# Patient Record
Sex: Female | Born: 1995 | Race: Black or African American | Hispanic: No | Marital: Single | Smoking: Never smoker
Health system: Southern US, Community
[De-identification: ages and names within clinical notes are randomized; demographics above are authoritative.]

## PROBLEM LIST (undated history)

## (undated) DIAGNOSIS — I10 Essential (primary) hypertension: Secondary | ICD-10-CM

## (undated) DIAGNOSIS — O139 Gestational [pregnancy-induced] hypertension without significant proteinuria, unspecified trimester: Secondary | ICD-10-CM

## (undated) DIAGNOSIS — Z789 Other specified health status: Secondary | ICD-10-CM

## (undated) HISTORY — DX: Gestational (pregnancy-induced) hypertension without significant proteinuria, unspecified trimester: O13.9

## (undated) HISTORY — PX: NO PAST SURGERIES: SHX2092

---

## 2020-02-06 ENCOUNTER — Encounter (HOSPITAL_COMMUNITY): Payer: Self-pay | Admitting: Obstetrics and Gynecology

## 2020-02-06 ENCOUNTER — Emergency Department (HOSPITAL_COMMUNITY)
Admission: EM | Admit: 2020-02-06 | Discharge: 2020-02-06 | Disposition: A | Discharge status: Home / Self Care | Payer: BC Managed Care – PPO | Attending: Emergency Medicine | Admitting: Emergency Medicine

## 2020-02-06 ENCOUNTER — Other Ambulatory Visit: Payer: Self-pay

## 2020-02-06 DIAGNOSIS — W228XXA Striking against or struck by other objects, initial encounter: Secondary | ICD-10-CM | POA: Insufficient documentation

## 2020-02-06 DIAGNOSIS — Y929 Unspecified place or not applicable: Secondary | ICD-10-CM | POA: Insufficient documentation

## 2020-02-06 DIAGNOSIS — Y999 Unspecified external cause status: Secondary | ICD-10-CM | POA: Diagnosis not present

## 2020-02-06 DIAGNOSIS — Y939 Activity, unspecified: Secondary | ICD-10-CM | POA: Diagnosis not present

## 2020-02-06 DIAGNOSIS — S0990XA Unspecified injury of head, initial encounter: Secondary | ICD-10-CM | POA: Diagnosis not present

## 2020-02-06 NOTE — Discharge Instructions (Signed)
Follow-up with your primary care provider in 2 days for continued evaluation.  Take Tylenol as needed for pain.  Return to the emergency room immediately for new or worsening symptoms or concerns, such as passing out, vomiting, worsening headache, or any concerns at all.

## 2020-02-06 NOTE — ED Provider Notes (Signed)
[facility]-EMERGENCY DEPT Provider Note   CSN: [id number] Arrival date & time: 02/06/20  1901     History Chief Complaint  Patient presents with  . Head Injury    Sydney Rice is a 24 y.o. female.  HPI  24 year old female presents status post head injury.  Patient states when she was walking approximately 9 PM last night she walked into the metal edge of a mantle.  She states that at the time she had some floaters in her vision and felt lightheaded.  She states that she did have a headache which resolved and then returned.  When the headache returned she became concerned and presented to the emergency room.  She denies LOC.  She denies syncope, vomiting, speech or gait difficulty, numbness, tingling.  Patient states she had to leave work early today secondary to her headache.    eviewed. No pertinent past medical history.  There are no problems to display for this patient.   OB History   No obstetric history on file.     History reviewed. No pertinent family history.  Social History   Tobacco Use  . Smoking status: Not on file  Substance Use Topics  . Alcohol use: Not on file  . Drug use: Not on file    Home Medications Prior to Admission medications   Not on File    Allergies    Patient has no allergy information on record.  Review of Systems   Review of Systems  Constitutional: Negative for chills and fever.  HENT: Negative for rhinorrhea and sore throat.   Eyes: Positive for visual disturbance.  Respiratory: Negative for cough and shortness of breath.   Cardiovascular: Negative for chest pain and leg swelling.  Gastrointestinal: Negative for abdominal pain, diarrhea, nausea and vomiting.  Genitourinary: Negative for dysuria, frequency and urgency.  Musculoskeletal: Negative for joint swelling and neck pain.  Skin: Negative for rash and wound.  Neurological: Positive for light-headedness. Negative for syncope and numbness.    All other systems reviewed and are negative.   Physical Exam Updated Vital Signs BP (!) 140/100 (BP Location: Left Arm)   Pulse 86   Temp 98.8 F (37.1 C) (Oral)   Resp 17   LMP 02/03/2020   SpO2 100%   Physical Exam Vitals and nursing note reviewed.  Constitutional:      Appearance: She is well-developed.  HENT:     Head: Normocephalic and atraumatic. No raccoon eyes, Battle's sign, right periorbital erythema or left periorbital erythema.     Jaw: There is normal jaw occlusion.     Right Ear: Tympanic membrane normal. No hemotympanum.     Left Ear: Tympanic membrane normal. No hemotympanum.     Nose: Nose normal.     Right Nostril: No septal hematoma.     Left Nostril: No septal hematoma.  Eyes:     Conjunctiva/sclera: Conjunctivae normal.  Cardiovascular:     Rate and Rhythm: Normal rate and regular rhythm.     Heart sounds: Normal heart sounds. No murmur heard.   Pulmonary:     Effort: Pulmonary effort is normal. No respiratory distress.     Breath sounds: Normal breath sounds. No wheezing or rales.  Abdominal:     General: Bowel sounds are normal. There is no distension.     Palpations: Abdomen is soft.     Tenderness: There is no abdominal tenderness.  Musculoskeletal:        General: No tenderness or deformity.  Normal range of motion.     Cervical back: Neck supple.  Skin:    General: Skin is warm and dry.     Findings: No erythema or rash.  Neurological:     General: No focal deficit present.     Mental Status: She is alert and oriented to person, place, and time.     GCS: GCS eye subscore is 4. GCS verbal subscore is 5. GCS motor subscore is 6.     Cranial Nerves: Cranial nerves are intact.     Sensory: Sensation is intact.     Motor: Motor function is intact.     Coordination: Coordination is intact.     Gait: Gait is intact.  Psychiatric:        Behavior: Behavior normal.     ED Results / Procedures / Treatments   Labs (all labs ordered are  listed, but only abnormal results are displayed) Labs Reviewed - No data to display  EKG None  Radiology No results found.  Procedures Procedures (including critical care time)  Medications Ordered in ED Medications - No data to display  ED Course  I have reviewed the triage vital signs and the nursing notes.  Pertinent labs & imaging results that were available during my care of the patient were reviewed by me and considered in my medical decision making (see chart for details).    MDM Rules/Calculators/A&P                            Resents 24 hours status post head injury.  She notes a headache which returned and prompted her concern.  She is very well-appearing, no acute distress, nontoxic, non-lethargic.  Neuro exam is nonfocal, nonlateralizing.  Mechanism of injury is mild, neuro exam is unremarkable.  Patient has no signs of head injury on physical exam.  Discussed how a head CT is not indicated at this time and patient expressed understanding.    She is agreeable to continue to monitor her symptoms at home.  Given return precautions.  Patient ready and stable for discharge.   At this time there does not appear to be any evidence of an acute emergency medical condition and the patient appears stable for discharge with appropriate outpatient follow up.Diagnosis was discussed with patient who verbalizes understanding and is agreeable to discharge.    Final Clinical Impression(s) / ED Diagnoses Final diagnoses:  None    Rx / DC Orders ED Discharge Orders    None       Rueben Bash 02/06/20 2306    Charlynne Pander, MD 02/07/20 [phone]

## 2020-02-06 NOTE — ED Triage Notes (Signed)
Patient reports head pain after walking into a fireplace mantel.

## 2020-06-10 ENCOUNTER — Encounter (HOSPITAL_COMMUNITY): Payer: Self-pay | Admitting: Emergency Medicine

## 2020-06-10 ENCOUNTER — Inpatient Hospital Stay (HOSPITAL_COMMUNITY): Payer: BC Managed Care – PPO

## 2020-06-10 ENCOUNTER — Other Ambulatory Visit: Payer: Self-pay

## 2020-06-10 ENCOUNTER — Inpatient Hospital Stay (HOSPITAL_COMMUNITY)
Admission: AD | Admit: 2020-06-10 | Discharge: 2020-06-10 | Disposition: A | Discharge status: Home / Self Care | Payer: BC Managed Care – PPO | Attending: Family Medicine | Admitting: Family Medicine

## 2020-06-10 DIAGNOSIS — O26891 Other specified pregnancy related conditions, first trimester: Secondary | ICD-10-CM | POA: Insufficient documentation

## 2020-06-10 DIAGNOSIS — N760 Acute vaginitis: Secondary | ICD-10-CM

## 2020-06-10 DIAGNOSIS — R1031 Right lower quadrant pain: Secondary | ICD-10-CM | POA: Diagnosis not present

## 2020-06-10 DIAGNOSIS — O26899 Other specified pregnancy related conditions, unspecified trimester: Secondary | ICD-10-CM

## 2020-06-10 DIAGNOSIS — O23591 Infection of other part of genital tract in pregnancy, first trimester: Secondary | ICD-10-CM | POA: Insufficient documentation

## 2020-06-10 DIAGNOSIS — Z3A08 8 weeks gestation of pregnancy: Secondary | ICD-10-CM | POA: Insufficient documentation

## 2020-06-10 DIAGNOSIS — R109 Unspecified abdominal pain: Secondary | ICD-10-CM | POA: Diagnosis not present

## 2020-06-10 LAB — CBC
HCT: 37.1 % (ref 36.0–46.0)
Hemoglobin: 12.5 g/dL (ref 12.0–15.0)
MCH: 29.6 pg (ref 26.0–34.0)
MCHC: 33.7 g/dL (ref 30.0–36.0)
MCV: 87.7 fL (ref 80.0–100.0)
Platelets: 265 10*3/uL (ref 150–400)
RBC: 4.23 MIL/uL (ref 3.87–5.11)
RDW: 12.7 % (ref 11.5–15.5)
WBC: 7.6 10*3/uL (ref 4.0–10.5)
nRBC: 0 % (ref 0.0–0.2)

## 2020-06-10 LAB — WET PREP, GENITAL
Sperm: NONE SEEN
Trich, Wet Prep: NONE SEEN
Yeast Wet Prep HPF POC: NONE SEEN

## 2020-06-10 LAB — URINALYSIS, ROUTINE W REFLEX MICROSCOPIC
Bilirubin Urine: NEGATIVE
Glucose, UA: NEGATIVE mg/dL
Hgb urine dipstick: NEGATIVE
Ketones, ur: NEGATIVE mg/dL
Leukocytes,Ua: NEGATIVE
Nitrite: NEGATIVE
Protein, ur: NEGATIVE mg/dL
Specific Gravity, Urine: 1.024 (ref 1.005–1.030)
pH: 5 (ref 5.0–8.0)

## 2020-06-10 LAB — POC URINE PREG, ED: Preg Test, Ur: POSITIVE — AB

## 2020-06-10 LAB — HCG, QUANTITATIVE, PREGNANCY: hCG, Beta Chain, Quant, S: 137832 m[IU]/mL — ABNORMAL HIGH (ref <5)

## 2020-06-10 LAB — ABO/RH: ABO/RH(D): O POS

## 2020-06-10 MED ORDER — METRONIDAZOLE 500 MG PO TABS: 500.0000 mg | ORAL_TABLET | Freq: Two times a day (BID) | ORAL | 0 refills | Status: DC

## 2020-06-10 MED ORDER — ACETAMINOPHEN 500 MG PO TABS
1000.0000 mg | ORAL_TABLET | Freq: Four times a day (QID) | ORAL | Status: DC | PRN
  Administered 2020-06-10: 1000 mg via ORAL
  Filled 2020-06-10: qty 2

## 2020-06-10 NOTE — MAU Provider Note (Signed)
History     CSN: [id number]  Arrival date and time: 06/10/20 1420   First Provider Initiated Contact with Patient 06/10/20 1638      Chief Complaint  Patient presents with  . Flank Pain   24 y.o. G1 @[redacted]w[redacted]d  by LMP presenting with RLQ pain and back pain. Pain started 1-1.5 mos ago. Describes as aching, RLQ, constant and radiated into right side of back. Has taken Tylenol and used heating pad which helps. Denies urinary sx. No VB. Having white vaginal discharge with malodor.    OB History    Gravida  1   Para      Term      Preterm      AB      Living        SAB      TAB      Ectopic      Multiple      Live Births              History reviewed. No pertinent past medical history.  History reviewed. No pertinent surgical history.  History reviewed. No pertinent family history.  Social History   Tobacco Use  . Smoking status: Never Smoker  . Smokeless tobacco: Never Used  Substance Use Topics  . Alcohol use: Not on file  . Drug use: Not on file    Allergies: No Known Allergies  No medications prior to admission.    Review of Systems  Constitutional: Negative for fever.  Gastrointestinal: Positive for abdominal pain, constipation and nausea. Negative for diarrhea and vomiting.  Genitourinary: Positive for frequency and vaginal discharge. Negative for dysuria, hematuria, urgency and vaginal bleeding.  Musculoskeletal: Positive for back pain.   Physical Exam   Blood pressure 122/75, pulse 75, temperature 98.3 F (36.8 C), temperature source Oral, resp. rate 20, height 5\' 1"  (1.549 m), weight 44.7 kg, last menstrual period 04/13/2020, SpO2 100 %.  Physical Exam Vitals and nursing note reviewed. Exam conducted with a chaperone present.  Constitutional:      Appearance: Normal appearance.  HENT:     Head: Normocephalic and atraumatic.  Cardiovascular:     Rate and Rhythm: Normal rate.  Pulmonary:     Effort: Pulmonary effort is normal. No  respiratory distress.  Abdominal:     General: There is no distension.     Palpations: Abdomen is soft. There is no mass.     Tenderness: There is no abdominal tenderness. There is no guarding or rebound.  Genitourinary:    Comments: External: no lesions or erythema Vagina: white, frothy discharge Uterus: + enlarged, anteverted, non tender, no CMT Adnexae: no masses, no tenderness left, no tenderness right Cervix closed Musculoskeletal:        General: Normal range of motion.     Cervical back: Normal range of motion.  Skin:    General: Skin is warm and dry.  Neurological:     General: No focal deficit present.     Mental Status: She is alert and oriented to person, place, and time.  Psychiatric:        Mood and Affect: Mood normal.        Behavior: Behavior normal.    Results for orders placed or performed during the hospital encounter of 06/10/20 (from the past 24 hour(s))  POC Urine Pregnancy, ED (not at [facility])     Status: Abnormal   Collection Time: 06/10/20  2:55 PM  Result Value Ref Range   Preg Test, Ur  POSITIVE (A) NEGATIVE  Urinalysis, Routine w reflex microscopic Urine, Clean Catch     Status: None   Collection Time: 06/10/20  3:58 PM  Result Value Ref Range   Color, Urine YELLOW YELLOW   APPearance CLEAR CLEAR   Specific Gravity, Urine 1.024 1.005 - 1.030   pH 5.0 5.0 - 8.0   Glucose, UA NEGATIVE NEGATIVE mg/dL   Hgb urine dipstick NEGATIVE NEGATIVE   Bilirubin Urine NEGATIVE NEGATIVE   Ketones, ur NEGATIVE NEGATIVE mg/dL   Protein, ur NEGATIVE NEGATIVE mg/dL   Nitrite NEGATIVE NEGATIVE   Leukocytes,Ua NEGATIVE NEGATIVE  CBC     Status: None   Collection Time: 06/10/20  4:08 PM  Result Value Ref Range   WBC 7.6 4.0 - 10.5 K/uL   RBC 4.23 3.87 - 5.11 MIL/uL   Hemoglobin 12.5 12.0 - 15.0 g/dL   HCT 11.9 36 - 46 %   MCV 87.7 80.0 - 100.0 fL   MCH 29.6 26.0 - 34.0 pg   MCHC 33.7 30.0 - 36.0 g/dL   RDW 41.7 40.8 - 14.4 %   Platelets 265 150 - 400 K/uL   nRBC  0.0 0.0 - 0.2 %  hCG, quantitative, pregnancy     Status: Abnormal   Collection Time: 06/10/20  4:08 PM  Result Value Ref Range   hCG, Beta Chain, Quant, S 818,563 (H) <5 mIU/mL  ABO/Rh     Status: None   Collection Time: 06/10/20  4:08 PM  Result Value Ref Range   ABO/RH(D) O POS    No rh immune globuloin      NOT A RH IMMUNE GLOBULIN CANDIDATE, PT RH POSITIVE Performed at [facility], [address], [place], [place] [address]   Wet prep, genital     Status: Abnormal   Collection Time: 06/10/20  4:51 PM   Specimen: PATH Cytology Cervicovaginal Ancillary Only  Result Value Ref Range   Yeast Wet Prep HPF POC NONE SEEN NONE SEEN   Trich, Wet Prep NONE SEEN NONE SEEN   Clue Cells Wet Prep HPF POC PRESENT (A) NONE SEEN   WBC, Wet Prep HPF POC MANY (A) NONE SEEN   Sperm NONE SEEN    US OB LESS THAN 14 WEEKS WITH OB TRANSVAGINAL  Addendum Date: 06/10/2020   ADDENDUM REPORT: 06/10/2020 18:24 ADDENDUM: Unit of measurement error in the initial report. CRL: 1.65 cm 8w 0d [place] EDC: 01/20/2021 Electronically Signed   By: Kreg Shropshire M.D.   On: 06/10/2020 18:24   Result Date: 06/10/2020 CLINICAL DATA:  Abdominal pain, quantitative hCG = [id number] EXAM: OBSTETRIC <14 WK [place] AND TRANSVAGINAL OB US TECHNIQUE: Both transabdominal and transvaginal ultrasound examinations were performed for complete evaluation of the gestation as well as the maternal uterus, adnexal regions, and pelvic cul-de-sac. Transvaginal technique was performed to assess early pregnancy. COMPARISON:  None. FINDINGS: Intrauterine gestational sac: Single, slightly elongated appearance of the gestational sac. Yolk sac:  Visualized. Embryo:  Visualized. Cardiac Activity: Visualized. Heart Rate: 166 bpm CRL:  1.65 mm   8 w   0 d                  [place] EDC: 01/20/2021 Subchorionic hemorrhage:  None visualized. Maternal uterus/adnexae: Maternal uterus is otherwise unremarkable. Probable corpus luteum in the right ovary. No concerning  adnexal lesions. No free fluid. IMPRESSION: Single intrauterine gestation at 8 weeks, 0 days by crown-rump length sonographic estimation. Slightly elongated appearance of the gestational sac in a nonspecific  finding though may consider short-term follow-up ultrasound and serial beta HCG measurement to confirm continued viability. Electronically Signed: By: Kreg Shropshire M.D. On: 06/10/2020 17:42   MAU Course  Procedures Tylenol  MDM Labs and [place] ordered and reviewed. Viable IUP on [place]. Will treat BV. Pain likely MSK, discussed comfort measures. Stable for discharge home.  Assessment and Plan   1. RLQ abdominal pain   2. Flank pain   3. Abdominal pain affecting pregnancy   4. [redacted] weeks gestation of pregnancy   5. Bacterial vaginosis    Discharge home Follow up at [facility] as scheduled Rx Flagyl SAB precautions  Allergies as of 06/10/2020   No Known Allergies     Medication List    TAKE these medications   metroNIDAZOLE 500 MG tablet Commonly known as: FLAGYL Take 1 tablet (500 mg total) by mouth 2 (two) times daily.   prenatal multivitamin Tabs tablet Take 1 tablet by mouth daily at 12 noon.      Donette Larry, CNM 06/10/2020, 6:29 PM

## 2020-06-10 NOTE — Discharge Instructions (Signed)
Abdominal Pain During Pregnancy  Belly (abdominal) pain is common during pregnancy. There are many possible causes. Most of the time, it is not a serious problem. Other times, it can be a sign that something is wrong with the pregnancy. Always tell your doctor if you have belly pain. Follow these instructions at home:  Do not have sex or put anything in your vagina until your pain goes away completely.  Get plenty of rest until your pain gets better.  Drink enough fluid to keep your pee (urine) pale yellow.  Take over-the-counter and prescription medicines only as told by your doctor.  Keep all follow-up visits as told by your doctor. This is important. Contact a doctor if:  Your pain continues or gets worse after resting.  You have lower belly pain that: ? Comes and goes at regular times. ? Spreads to your back. ? Feels like menstrual cramps.  You have pain or burning when you pee (urinate). Get help right away if:  You have a fever or chills.  You have vaginal bleeding.  You are leaking fluid from your vagina.  You are passing tissue from your vagina.  You throw up (vomit) for more than 24 hours.  You have watery poop (diarrhea) for more than 24 hours.  Your baby is moving less than usual.  You feel very weak or faint.  You have shortness of breath.  You have very bad pain in your upper belly. Summary  Belly (abdominal) pain is common during pregnancy. There are many possible causes.  If you have belly pain during pregnancy, tell your doctor right away.  Keep all follow-up visits as told by your doctor. This is important. This information is not intended to replace advice given to you by your health care provider. Make sure you discuss any questions you have with your health care provider. Document Revised: 10/17/2018 Document Reviewed: 10/01/2016 Elsevier Patient Education  2020 [employer] Inc[employer]   Bacterial Vaginosis  Bacterial vaginosis is an infection of  the vagina. It happens when too many normal germs (healthy bacteria) grow in the vagina. This infection puts you at risk for infections from sex (STIs). Treating this infection can lower your risk for some STIs. You should also treat this if you are pregnant. It can cause your baby to be born early. Follow these instructions at home: Medicines  Take over-the-counter and prescription medicines only as told by your doctor.  Take or use your antibiotic medicine as told by your doctor. Do not stop taking or using it even if you start to feel better. General instructions  If you your sexual partner is a woman, tell her that you have this infection. She needs to get treatment if she has symptoms. If you have a female partner, he does not need to be treated.  During treatment: ? Avoid sex. ? Do not douche. ? Avoid alcohol as told. ? Avoid breastfeeding as told.  Drink enough fluid to keep your pee (urine) clear or pale yellow.  Keep your vagina and butt (rectum) clean. ? Wash the area with warm water every day. ? Wipe from front to back after you use the toilet.  Keep all follow-up visits as told by your doctor. This is important. Preventing this condition  Do not douche.  Use only warm water to wash around your vagina.  Use protection when you have sex. This includes: ? Latex condoms. ? Dental dams.  Limit how many people you have sex with. It is best   only have sex with the same person (be monogamous).  Get tested for STIs. Have your partner get tested.  Wear underwear that is cotton or lined with cotton.  Avoid tight pants and pantyhose. This is most important in summer.  Do not use any products that have nicotine or tobacco in them. These include cigarettes and e-cigarettes. If you need help quitting, ask your doctor.  Do not use illegal drugs.  Limit how much alcohol you drink. Contact a doctor if:  Your symptoms do not get better, even after you are treated.  You  have more discharge or pain when you pee (urinate).  You have a fever.  You have pain in your belly (abdomen).  You have pain with sex.  Your bleed from your vagina between periods. Summary  This infection happens when too many germs (bacteria) grow in the vagina.  Treating this condition can lower your risk for some infections from sex (STIs).  You should also treat this if you are pregnant. It can cause early (premature) birth.  Do not stop taking or using your antibiotic medicine even if you start to feel better. This information is not intended to replace advice given to you by your health care provider. Make sure you discuss any questions you have with your health care provider. Document Revised: 06/11/2017 Document Reviewed: 03/14/2016 Elsevier Patient Education  2020 [employer].

## 2020-06-10 NOTE — ED Triage Notes (Addendum)
Emergency Medicine Provider OB Triage Evaluation Note  Sydney Rice is a 24 y.o. female, No obstetric history on file., at Unknown gestation who presents to the emergency department with complaints of right lower abdominal pain and right-sided back pain.  No dysuria.  Has had a white vaginal discharge.  States she has been seen by OB previously however does not know the name of this and does not know if she has had ultrasound previously.  She denies fever, chills, nausea, vomiting.  Review of  Systems  Positive: Abdominal pain Negative: Lightheadedness, dizziness, dysuria, hematuria, pelvic pain, recent falls or injury. No hx of renal stones  Physical Exam  BP 129/72 (BP Location: Right Arm)   Pulse (!) 108   Temp 98.3 F (36.8 C) (Oral)   Resp 16   SpO2 100%  General: Awake, no distress  HEENT: Atraumatic  Resp: Normal effort  Cardiac: Normal rate Abd: Nondistended, nontender  MSK: Moves all extremities without difficulty Neuro: Speech clear  Medical Decision Making  Pt evaluated for pregnancy concern and is stable for transfer to MAU. Pt is in agreement with plan for transfer.  3:25 PM Discussed with MAU provider who accepts patient in transfer.  Clinical Impression   1. RLQ abdominal pain   2. Flank pain        Kimsey Demaree A, PA-C 06/10/20 1525    Deron Poole A, PA-C 06/10/20 1530

## 2020-06-10 NOTE — Progress Notes (Signed)
GC/Chlamydia & Wet prep cultures obtained by M. Denyse Amass, CNM.

## 2020-06-10 NOTE — ED Triage Notes (Signed)
Pt states she is [redacted] weeks pregnant c/o right flank pain and vaginal discharge no bleeding.

## 2020-06-10 NOTE — MAU Note (Signed)
Presents with sided pain that radiates into her back.  Reports has had pain for 1-1.5 months.  States was using Tylenol, but stopped once pregnant.  Denies VB.  LMP 04/13/2020

## 2020-06-11 LAB — GC/CHLAMYDIA PROBE AMP (~~LOC~~) NOT AT ARMC
Chlamydia: POSITIVE — AB
Comment: NEGATIVE
Comment: NORMAL
Neisseria Gonorrhea: NEGATIVE

## 2020-06-14 ENCOUNTER — Telehealth: Payer: Self-pay | Admitting: Certified Nurse Midwife

## 2020-06-14 NOTE — Telephone Encounter (Signed)
+  Chlamydia. Pt states her PCP Rx'd Azithromycin yesterday and she has taken it. Her partner also needs treatment and they should abstain from sex for 2 weeks.

## 2020-07-13 NOTE — L&D Delivery Note (Signed)
Delivery Note Called to bedside and patient complete and pushing. At 4:01 AM a viable female was delivered via Vaginal, Spontaneous (Presentation: Middle Occiput Anterior).  APGAR: 8,9; weight 3201g.   Placenta status: Spontaneous;Pathology, Intact.  Cord: 3 vessels with the following complications: None. Placenta to pathology given postpartum fever immediately after delivery and infant fever.  Anesthesia: Epidural Episiotomy: None Lacerations: Left Labial, hemostatic Est. Blood Loss (mL): 458 mL  Mom to postpartum.  Baby to Couplet care / Skin to Skin.  Alric Seton 01/04/2021, 4:21 AM

## 2020-07-16 ENCOUNTER — Ambulatory Visit (INDEPENDENT_AMBULATORY_CARE_PROVIDER_SITE_OTHER): Payer: Self-pay | Admitting: Advanced Practice Midwife

## 2020-07-16 ENCOUNTER — Other Ambulatory Visit: Payer: Self-pay

## 2020-07-16 ENCOUNTER — Other Ambulatory Visit (HOSPITAL_COMMUNITY)
Admission: RE | Admit: 2020-07-16 | Discharge: 2020-07-16 | Disposition: A | Discharge status: Home / Self Care | Payer: Medicaid Other | Source: Ambulatory Visit | Attending: Advanced Practice Midwife | Admitting: Advanced Practice Midwife

## 2020-07-16 ENCOUNTER — Encounter: Payer: Self-pay | Admitting: Advanced Practice Midwife

## 2020-07-16 VITALS — BP 114/74 | HR 89 | Wt 103.4 lb

## 2020-07-16 DIAGNOSIS — Z3480 Encounter for supervision of other normal pregnancy, unspecified trimester: Secondary | ICD-10-CM | POA: Insufficient documentation

## 2020-07-16 DIAGNOSIS — Z3A13 13 weeks gestation of pregnancy: Secondary | ICD-10-CM

## 2020-07-16 DIAGNOSIS — Z3401 Encounter for supervision of normal first pregnancy, first trimester: Secondary | ICD-10-CM | POA: Diagnosis not present

## 2020-07-16 DIAGNOSIS — A749 Chlamydial infection, unspecified: Secondary | ICD-10-CM | POA: Insufficient documentation

## 2020-07-16 DIAGNOSIS — O98811 Other maternal infectious and parasitic diseases complicating pregnancy, first trimester: Secondary | ICD-10-CM

## 2020-07-16 MED ORDER — ASPIRIN EC 81 MG PO TBEC: 81.0000 mg | DELAYED_RELEASE_TABLET | Freq: Every day | ORAL | 2 refills | Status: DC

## 2020-07-16 NOTE — Patient Instructions (Addendum)
Second Trimester of Pregnancy  The second trimester is from week 14 through week 27 (month 4 through 6). This is often the time in pregnancy that you feel your best. Often times, morning sickness has lessened or quit. You may have more energy, and you may get hungry more often. Your unborn baby is growing rapidly. At the end of the sixth month, he or she is about 9 inches long and weighs about 1 pounds. You will likely feel the baby move between 18 and 20 weeks of pregnancy. Follow these instructions at home: Medicines  Take over-the-counter and prescription medicines only as told by your doctor. Some medicines are safe and some medicines are not safe during pregnancy.  Take a prenatal vitamin that contains at least 600 micrograms (mcg) of folic acid.  If you have trouble pooping (constipation), take medicine that will make your stool soft (stool softener) if your doctor approves. Eating and drinking   Eat regular, healthy meals.  Avoid raw meat and uncooked cheese.  If you get low calcium from the food you eat, talk to your doctor about taking a daily calcium supplement.  Avoid foods that are high in fat and sugars, such as fried and sweet foods.  If you feel sick to your stomach (nauseous) or throw up (vomit): ? Eat 4 or 5 small meals a day instead of 3 large meals. ? Try eating a few soda crackers. ? Drink liquids between meals instead of during meals.  To prevent constipation: ? Eat foods that are high in fiber, like fresh fruits and vegetables, whole grains, and beans. ? Drink enough fluids to keep your pee (urine) clear or pale yellow. Activity  Exercise only as told by your doctor. Stop exercising if you start to have cramps.  Do not exercise if it is too hot, too humid, or if you are in a place of great height (high altitude).  Avoid heavy lifting.  Wear low-heeled shoes. Sit and stand up straight.  You can continue to have sex unless your doctor tells you not  to. Relieving pain and discomfort  Wear a good support bra if your breasts are tender.  Take warm water baths (sitz baths) to soothe pain or discomfort caused by hemorrhoids. Use hemorrhoid cream if your doctor approves.  Rest with your legs raised if you have leg cramps or low back pain.  If you develop puffy, bulging veins (varicose veins) in your legs: ? Wear support hose or compression stockings as told by your doctor. ? Raise (elevate) your feet for 15 minutes, 3-4 times a day. ? Limit salt in your food. Prenatal care  Write down your questions. Take them to your prenatal visits.  Keep all your prenatal visits as told by your doctor. This is important. Safety  Wear your seat belt when driving.  Make a list of emergency phone numbers, including numbers for family, friends, the hospital, and police and fire departments. General instructions  Ask your doctor about the right foods to eat or for help finding a counselor, if you need these services.  Ask your doctor about local prenatal classes. Begin classes before month 6 of your pregnancy.  Do not use hot tubs, steam rooms, or saunas.  Do not douche or use tampons or scented sanitary pads.  Do not cross your legs for long periods of time.  Visit your dentist if you have not done so. Use a soft toothbrush to brush your teeth. Floss gently.  Avoid all smoking, herbs,   and alcohol. Avoid drugs that are not approved by your doctor.  Do not use any products that contain nicotine or tobacco, such as cigarettes and e-cigarettes. If you need help quitting, ask your doctor.  Avoid cat litter boxes and soil used by cats. These carry germs that can cause birth defects in the baby and can cause a loss of your baby (miscarriage) or stillbirth. Contact a doctor if:  You have mild cramps or pressure in your lower belly.  You have pain when you pee (urinate).  You have bad smelling fluid coming from your vagina.  You continue to  feel sick to your stomach (nauseous), throw up (vomit), or have watery poop (diarrhea).  You have a nagging pain in your belly area.  You feel dizzy. Get help right away if:  You have a fever.  You are leaking fluid from your vagina.  You have spotting or bleeding from your vagina.  You have severe belly cramping or pain.  You lose or gain weight rapidly.  You have trouble catching your breath and have chest pain.  You notice sudden or extreme puffiness (swelling) of your face, hands, ankles, feet, or legs.  You have not felt the baby move in over an hour.  You have severe headaches that do not go away when you take medicine.  You have trouble seeing. Summary  The second trimester is from week 14 through week 27 (months 4 through 6). This is often the time in pregnancy that you feel your best.  To take care of yourself and your unborn baby, you will need to eat healthy meals, take medicines only if your doctor tells you to do so, and do activities that are safe for you and your baby.  Call your doctor if you get sick or if you notice anything unusual about your pregnancy. Also, call your doctor if you need help with the right food to eat, or if you want to know what activities are safe for you. This information is not intended to replace advice given to you by your health care provider. Make sure you discuss any questions you have with your health care provider. Document Revised: 10/21/2018 Document Reviewed: 08/04/2016 Elsevier Patient Education  2020 Elsevier Inc.  Safe Medications in Pregnancy   Acne: Benzoyl Peroxide Salicylic Acid  Backache/Headache: Tylenol: 2 regular strength every 4 hours OR              2 Extra strength every 6 hours  Colds/Coughs/Allergies: Benadryl (alcohol free) 25 mg every 6 hours as needed Breath right strips Claritin Cepacol throat lozenges Chloraseptic throat spray Cold-Eeze- up to three times per day Cough drops, alcohol  free Flonase (by prescription only) Guaifenesin Mucinex Robitussin DM (plain only, alcohol free) Saline nasal spray/drops Sudafed (pseudoephedrine) & Actifed ** use only after [redacted] weeks gestation and if you do not have high blood pressure Tylenol Vicks Vaporub Zinc lozenges Zyrtec   Constipation: Colace Ducolax suppositories Fleet enema Glycerin suppositories Metamucil Milk of magnesia Miralax Senokot Smooth move tea  Diarrhea: Kaopectate Imodium A-D  *NO pepto Bismol  Hemorrhoids: Anusol Anusol HC Preparation H Tucks  Indigestion: Tums Maalox Mylanta Zantac  Pepcid  Insomnia: Benadryl (alcohol free) 25mg every 6 hours as needed Tylenol PM Unisom, no Gelcaps  Leg Cramps: Tums MagGel  Nausea/Vomiting:  Bonine Dramamine Emetrol Ginger extract Sea bands Meclizine  Nausea medication to take during pregnancy:  Unisom (doxylamine succinate 25 mg tablets) Take one tablet daily at bedtime. If symptoms are   not adequately controlled, the dose can be increased to a maximum recommended dose of two tablets daily (1/2 tablet in the morning, 1/2 tablet mid-afternoon and one at bedtime). Vitamin B6 100mg tablets. Take one tablet twice a day (up to 200 mg per day).  Skin Rashes: Aveeno products Benadryl cream or 25mg every 6 hours as needed Calamine Lotion 1% cortisone cream  Yeast infection: Gyne-lotrimin 7 Monistat 7   **If taking multiple medications, please check labels to avoid duplicating the same active ingredients **take medication as directed on the label ** Do not exceed 4000 mg of tylenol in 24 hours **Do not take medications that contain aspirin or ibuprofen     

## 2020-07-16 NOTE — Progress Notes (Signed)
History:   Sydney Rice is a 25 y.o. G1P0000 at [redacted]w[redacted]d by LMP c/w 8 weeks ultrasound being seen today for her first obstetrical visit.  No obstetrical history. Patient does intend to breast feed. Pregnancy history fully reviewed.  Patient reports nausea which recently resolved without intervention. She and partner are s/p diagnosis and treatment of Chlamydia.       HISTORY: OB History  Gravida Para Term Preterm AB Living  1 0 0 0 0 0  SAB IAB Ectopic Multiple Live Births  0 0 0 0 0    # Outcome Date GA Lbr Len/2nd Weight Sex Delivery Anes PTL Lv  1 Current              History reviewed. No pertinent past medical history. History reviewed. No pertinent surgical history. History reviewed. No pertinent family history. Social History   Tobacco Use  . Smoking status: Never Smoker  . Smokeless tobacco: Never Used  Vaping Use  . Vaping Use: Never used  Substance Use Topics  . Alcohol use: Not Currently  . Drug use: Not Currently    Types: Marijuana   No Known Allergies Current Outpatient Medications on File Prior to Visit  Medication Sig Dispense Refill  . Prenatal Vit-Fe Fumarate-FA (PRENATAL MULTIVITAMIN) TABS tablet Take 1 tablet by mouth daily at 12 noon.     No current facility-administered medications on file prior to visit.    Review of Systems Pertinent items noted in HPI and remainder of comprehensive ROS otherwise negative.  Physical Exam:   Vitals:   07/16/20 1432  BP: 114/74  Pulse: 89  Weight: 103 lb 6.4 oz (46.9 kg)   Fetal Heart Rate (bpm): 163  By Doppler  General: well-developed, well-nourished female in no acute distress  Breasts:  Deferred  Skin: normal coloration and turgor, no rashes  Neurologic: oriented, normal, negative, normal mood  Extremities: normal strength, tone, and muscle mass, ROM of all joints is normal  HEENT PERRLA, extraocular movement intact and sclera clear, anicteric  Neck supple and no masses  Cardiovascular:  regular rate and rhythm  Respiratory:  no respiratory distress, normal breath sounds  Abdomen: soft, non-tender; bowel sounds normal; no masses,  no organomegaly  Pelvic: Deferred    Assessment:    Pregnancy: G1P0000 Patient Active Problem List   Diagnosis Date Noted  . Chlamydia infection affecting pregnancy 07/16/2020  . Encounter for supervision of normal first pregnancy in first trimester 07/16/2020   Indications for ASA therapy (per uptodate)  Two or more of the following: Nulliparity Yes Obesity (body mass index >30 kg/m2) No Family history of preeclampsia in mother or sister No Age ?35 years No Sociodemographic characteristics (African American race, low socioeconomic level) Yes Personal risk factors (eg, previous pregnancy with low birth weight or small for gestational age infant, previous adverse pregnancy outcome [eg, stillbirth], interval >10 years between pregnancies) No   Plan:    1. Encounter for supervision of normal first pregnancy in first trimester - Welcomed to practice - Discussed major milestones in prenatal care - bASA recommendations, criteria for prescribing reviewed - Culture, OB Urine - CBC/D/Plt+RPR+Rh+ABO+Rub Ab... - CHL AMB BABYSCRIPTS SCHEDULE OPTIMIZATION - [place] MFM OB COMP + 14 WK; Future - Cervicovaginal ancillary only( [place]) - Genetic Screening  2. Chlamydia infection affecting pregnancy in first trimester - TOC today  3. [redacted] weeks gestation of pregnancy  Initial labs drawn. Continue prenatal vitamins. Problem list reviewed and updated. Genetic Screening discussed, First trimester screen,  Quad screen and NIPS: requested. Ultrasound discussed; fetal anatomic survey: ordered. Anticipatory guidance about prenatal visits given including labs, ultrasounds, and testing. Discussed usage of Babyscripts and virtual visits as additional source of managing and completing prenatal visits in midst of coronavirus and pandemic.   Encouraged to  complete MyChart Registration for her ability to review results, send requests, and have questions addressed.  The nature of [facility] with multiple MDs and Advanced Practice Providers was explained to patient; also emphasized that residents, students are part of our team. Routine obstetric precautions reviewed. Encouraged to seek out care at office or emergency room [facility] preferred) for urgent and/or emergent concerns. Return in about 4 weeks (around 08/13/2020) for Any Provider.     Clayton Bibles, MSN, CNM Certified Nurse Midwife, [facility] for [employer], [facility] 07/16/20 8:49 PM

## 2020-07-17 ENCOUNTER — Telehealth: Payer: Self-pay | Admitting: Advanced Practice Midwife

## 2020-07-17 DIAGNOSIS — B9689 Other specified bacterial agents as the cause of diseases classified elsewhere: Secondary | ICD-10-CM

## 2020-07-17 LAB — CBC/D/PLT+RPR+RH+ABO+RUB AB...
Antibody Screen: NEGATIVE
Basophils Absolute: 0 10*3/uL (ref 0.0–0.2)
Basos: 0 %
EOS (ABSOLUTE): 0.1 10*3/uL (ref 0.0–0.4)
Eos: 2 %
HCV Ab: <0.1 s/co ratio (ref 0.0–0.9)
HIV Screen 4th Generation wRfx: NONREACTIVE
Hematocrit: 31.4 % — ABNORMAL LOW (ref 34.0–46.6)
Hemoglobin: 10.4 g/dL — ABNORMAL LOW (ref 11.1–15.9)
Hepatitis B Surface Ag: NEGATIVE
Immature Grans (Abs): 0.1 10*3/uL (ref 0.0–0.1)
Immature Granulocytes: 1 %
Lymphocytes Absolute: 1.5 10*3/uL (ref 0.7–3.1)
Lymphs: 20 %
MCH: 29.6 pg (ref 26.6–33.0)
MCHC: 33.1 g/dL (ref 31.5–35.7)
MCV: 90 fL (ref 79–97)
Monocytes Absolute: 0.4 10*3/uL (ref 0.1–0.9)
Monocytes: 6 %
Neutrophils Absolute: 5.6 10*3/uL (ref 1.4–7.0)
Neutrophils: 71 %
Platelets: 230 10*3/uL (ref 150–450)
RBC: 3.51 x10E6/uL — ABNORMAL LOW (ref 3.77–5.28)
RDW: 13.5 % (ref 11.7–15.4)
RPR Ser Ql: NONREACTIVE
Rh Factor: POSITIVE
Rubella Antibodies, IGG: 4.03 index (ref >0.99)
WBC: 7.7 10*3/uL (ref 3.4–10.8)

## 2020-07-17 LAB — CERVICOVAGINAL ANCILLARY ONLY
Bacterial Vaginitis (gardnerella): POSITIVE — AB
Candida Glabrata: NEGATIVE
Candida Vaginitis: NEGATIVE
Chlamydia: NEGATIVE
Comment: NEGATIVE
Comment: NEGATIVE
Comment: NEGATIVE
Comment: NEGATIVE
Comment: NEGATIVE
Comment: NORMAL
Neisseria Gonorrhea: NEGATIVE
Trichomonas: NEGATIVE

## 2020-07-17 LAB — HCV INTERPRETATION

## 2020-07-17 MED ORDER — METRONIDAZOLE 500 MG PO TABS: 500.0000 mg | ORAL_TABLET | Freq: Two times a day (BID) | ORAL | 0 refills | Status: DC

## 2020-07-17 NOTE — Telephone Encounter (Signed)
Attempted to notify patient of + BV on swab collected yesterday. Voicemail full, unable to leave message. No MyChart account. Will send medication and ask office staff to notify patient.  Clayton Bibles, MSN, CNM Certified Nurse Midwife, [employer] for [employer], [facility] 07/17/20 4:12 PM

## 2020-07-18 ENCOUNTER — Telehealth: Payer: Self-pay | Admitting: General Practice

## 2020-07-18 LAB — URINE CULTURE, OB REFLEX

## 2020-07-18 LAB — CULTURE, OB URINE

## 2020-07-18 NOTE — Telephone Encounter (Signed)
-----   Message from Samantha C Weinhold, CNM sent at 07/17/2020  4:14 PM EST ----- + BV, I called patient but voicemail is full. MyChart not set up. Rx sent to [facility] [address], could you please attempt another phone call? Thanks! SW 

## 2020-07-19 NOTE — Telephone Encounter (Signed)
Called patient, no answer- unable to leave voicemail message as voicemail was full

## 2020-07-19 NOTE — Telephone Encounter (Deleted)
-----   Message from Calvert Cantor, [place] sent at 07/17/2020  4:14 PM EST ----- + BV, I called patient but voicemail is full. MyChart not set up. Rx sent to [facility] [address], could you please attempt another phone call? Thanks! SW

## 2020-07-22 NOTE — Telephone Encounter (Signed)
Called patient and received message that phone number not in service. My Chart not active. Will send letter to patient.

## 2020-08-12 ENCOUNTER — Encounter: Payer: Self-pay | Admitting: General Practice

## 2020-08-13 ENCOUNTER — Other Ambulatory Visit: Payer: Self-pay

## 2020-08-13 ENCOUNTER — Ambulatory Visit (INDEPENDENT_AMBULATORY_CARE_PROVIDER_SITE_OTHER): Payer: Medicaid Other | Admitting: Nurse Practitioner

## 2020-08-13 VITALS — BP 110/76 | HR 88 | Wt 107.4 lb

## 2020-08-13 DIAGNOSIS — Z3A17 17 weeks gestation of pregnancy: Secondary | ICD-10-CM

## 2020-08-13 DIAGNOSIS — Z3401 Encounter for supervision of normal first pregnancy, first trimester: Secondary | ICD-10-CM

## 2020-08-13 DIAGNOSIS — Z7189 Other specified counseling: Secondary | ICD-10-CM | POA: Diagnosis not present

## 2020-08-13 DIAGNOSIS — O26892 Other specified pregnancy related conditions, second trimester: Secondary | ICD-10-CM

## 2020-08-13 DIAGNOSIS — R42 Dizziness and giddiness: Secondary | ICD-10-CM

## 2020-08-13 DIAGNOSIS — N9489 Other specified conditions associated with female genital organs and menstrual cycle: Secondary | ICD-10-CM

## 2020-08-13 MED ORDER — BLOOD PRESSURE KIT DEVI: 1.0000 | 0 refills | Status: DC | PRN

## 2020-08-13 NOTE — Patient Instructions (Addendum)
[email or web address]  Childbirth Education Options: [facility] Department Classes:  Childbirth education classes can help you get ready for a positive parenting experience. You can also meet other expectant parents and get free stuff for your baby. Each class runs for five weeks on the same night and costs $45 for the mother-to-be and her support person. Medicaid covers the cost if you are eligible. Call [phone] to register. [facility] Childbirth Education:  [phone] or [phone] or [email or web address]  Baby & Me Class: Discuss newborn & infant parenting and family adjustment issues with other new mothers in a relaxed environment. Each week brings a new speaker or baby-centered activity. We encourage new mothers to join [place] every Thursday at 11:00am. Babies birth until crawling. No registration or fee. Daddy [employer]: This course offers Dads-to-be the tools and knowledge needed to feel confident on their journey to becoming new fathers. Experienced dads, who have been trained as coaches, teach dads-to-be how to hold, comfort, diaper, swaddle and play with their infant while being able to support the new mom as well. A class for men taught by men. $25/dad Big Brother/Big Sister: Let your children share in the joy of a new brother or sister in this special class designed just for them. Class includes discussion about how families care for babies: swaddling, holding, diapering, safety as well as how they can be helpful in their new role. This class is designed for children ages 2 to 7, but any age is welcome. Please register each child individually. $5/child  Mom Talk: This mom-led group offers support and connection to mothers as they journey through the adjustments and struggles of that sometimes overwhelming first year after the birth of a child. Tuesdays at 10:00am and Thursdays at 6:00pm. Babies welcome. No registration or fee. Breastfeeding Support Group: This group  is a mother-to-mother support circle where moms have the opportunity to share their breastfeeding experiences. A Lactation Consultant is present for questions and concerns. Meets each Tuesday at 11:00am. No fee or registration. Breastfeeding Your Baby: Learn what to expect in the first days of breastfeeding your newborn.  This class will help you feel more confident with the skills needed to begin your breastfeeding experience. Many new mothers are concerned about breastfeeding after leaving the hospital. This class will also address the most common fears and challenges about breastfeeding during the first few weeks, months and beyond. (call for fee) Comfort Techniques and Tour: This 2 hour interactive class will provide you the opportunity to learn & practice hands-on techniques that can help relieve some of the discomfort of labor and encourage your baby to rotate toward the best position for birth. You and your partner will be able to try a variety of labor positions with birth balls and rebozos as well as practice breathing, relaxation, and visualization techniques. A tour of the [facility] is included with this class. $20 per registrant and support person Childbirth Class- Weekend Option: This class is a Weekend version of our Birth & Baby series. It is designed for parents who have a difficult time fitting several weeks of classes into their schedule. It covers the care of your newborn and the basics of labor and childbirth. It also includes a Maternity Care Center Tour of [facility] and lunch. The class is held two consecutive days: beginning on Friday evening from 6:30 - 8:30 p.m. and the next day, Saturday from 9 a.m. - 4 p.m. (call for fee) Linden Dolin Class: Interested in a waterbirth?  This informational class will help you discover whether waterbirth is the right fit for you. Education about waterbirth itself, supplies you would need and how to assemble your support  team is what you can expect from this class. Some obstetrical practices require this class in order to pursue a waterbirth. (Not all obstetrical practices offer waterbirth-check with your healthcare provider.) Register only the expectant mom, but you are encouraged to bring your partner to class! Required if planning waterbirth, no fee. Infant/Child CPR: Parents, grandparents, babysitters, and friends learn Cardio-Pulmonary Resuscitation skills for infants and children. You will also learn how to treat both conscious and unconscious choking in infants and children. This Family & Friends program does not offer certification. Register each participant individually to ensure that enough mannequins are available. (Call for fee) Grandparent Love: Expecting a grandbaby? This class is for you! Learn about the latest infant care and safety recommendations and ways to support your own child as he or she transitions into the parenting role. Taught by Registered Nurses who are childbirth instructors, but most importantly...they are grandmothers too! $10/person. Childbirth Class- Natural Childbirth: This series of 5 weekly classes is for expectant parents who want to learn and practice natural methods of coping with the process of labor and childbirth. Relaxation, breathing, massage, visualization, role of the partner, and helpful positioning are highlighted. Participants learn how to be confident in their body's ability to give birth. This class will empower and help parents make informed decisions about their own care. Includes discussion that will help new parents transition into the immediate postpartum period. [facility] is included. We suggest taking this class between 25-32 weeks, but it's only a recommendation. $75 per registrant and one support person or $30 Medicaid. Childbirth Class- 3 week Series: This option of 3 weekly classes helps you and your labor partner prepare for  childbirth. Newborn care, labor & birth, cesarean birth, pain management, and comfort techniques are discussed and a [facility] is included. The class meets at the same time, on the same day of the week for 3 consecutive weeks beginning with the starting date you choose. $60 for registrant and one support person.  Marvelous Multiples: Expecting twins, triplets, or more? This class covers the differences in labor, birth, parenting, and breastfeeding issues that face multiples' parents. NICU tour is included. Led by a Certified Childbirth Educator who is the mother of twins. No fee. Caring for Baby: This class is for expectant and adoptive parents who want to learn and practice the most up-to-date newborn care for their babies. Focus is on birth through the first six weeks of life. Topics include feeding, bathing, diapering, crying, umbilical cord care, circumcision care and safe sleep. Parents learn to recognize symptoms of illness and when to call the pediatrician. Register only the mom-to-be and your partner or support person can plan to come with you! $10 per registrant and support person Childbirth Class- online option: This online class offers you the freedom to complete a Birth and Baby series in the comfort of your own home. The flexibility of this option allows you to review sections at your own pace, at times convenient to you and your support people. It includes additional video information, animations, quizzes, and extended activities. Get organized with helpful eClass tools, checklists, and trackers. Once you register online for the class, you will receive an email within a few days to accept the invitation and begin the class when the  time is right for you. The content will be available to you for 60 days. $60 for 60 days of online access for you and your support people.   Check out Covid vaccine info here:   [email or web address]  7 Things You  Should Know About the COVID-19 Vaccines  Here are seven facts you should know before taking your shot:  1.  No serious side effects were reported in clinical trials. Temporary reactions after receiving the vaccine may include a sore arm, headache, feeling tired and achy for a day or two or, in some cases, fever. In most cases, these reactions are good signs that your body is building protection.   2.  Scientists had a head start. They are built on decades of research on vaccines for similar viruses. A big investment of resources and focus made sure they were created without skipping any steps in development, testing, or clinical trials.  3. You cannot get COVID-19 from the vaccine. The vaccine gives your body instructions to make a protein that safely teaches you to make germ-fighting antibodies to fight the real COVID-19.  4.The vaccine protects against the Delta variant. The Delta variant, which is now predominant in [place], is much more contagious than the original virus. Vaccines continue to be remarkably effective in reducing risk of severe disease, hospitalization, and death, even against the Delta variant.  5. A hundred million people in the [place] have already received their COVID-19 vaccine.  6. It works. And once you're fully vaccinated you're protected. The vaccines are proven to help prevent COVID-19 and are effective in preventing hospitalization and death.   7. The vaccine does not affect fertility. Vaccination for those who are pregnant or wanting to become pregnant is recommended by the [employer] of Obstetricians and Gynecologists (ACOG), the Society for Maternal-Fetal Medicine (SMFM), the American Society for Reproductive Medicine (ASRM), and the Society for Female Reproduction and Urology.

## 2020-08-13 NOTE — Progress Notes (Signed)
Subjective:  Sydney Rice is a 25 y.o. G1P0000 at [redacted]w[redacted]d being seen today for ongoing prenatal care.  She is currently monitored for the following issues for this low-risk pregnancy and has Chlamydia infection affecting pregnancy and Encounter for supervision of normal first pregnancy in first trimester on their problem list.  Patient reports periodic lightheadedness.  Contractions: Not present. Vag. Bleeding: None.  Movement: Absent. Denies leaking of fluid.   The following portions of the patient's history were reviewed and updated as appropriate: allergies, current medications, past family history, past medical history, past social history, past surgical history and problem list. Problem list updated.  Objective:   Vitals:   08/13/20 1339  BP: 110/76  Pulse: 88  Weight: 107 lb 6.4 oz (48.7 kg)    Fetal Status: Fetal Heart Rate (bpm): 153   Movement: Absent   Fundus just under the umbilicus by palpation  General:  Alert, oriented and cooperative. Patient is in no acute distress.  Skin: Skin is warm and dry. No rash noted.   Cardiovascular: Normal heart rate noted  Respiratory: Normal respiratory effort, no problems with respiration noted  Abdomen: Soft, gravid, appropriate for gestational age. Pain/Pressure: Present     Pelvic:  Cervical exam deferred        Extremities: Normal range of motion.  Edema: None  Mental Status: Normal mood and affect. Normal behavior. Normal judgment and thought content.   Urinalysis:      Assessment and Plan:  Pregnancy: G1P0000 at [redacted]w[redacted]d  1. Encounter for supervision of normal first pregnancy in first trimester Got the email for [facility] and will try to download Plans to get BP cuff Advised to arrange for childbirth and breastfeeding classes Reviewed upcoming ultrasound Is taking baby aspirin  - Blood Pressure Monitoring (BLOOD PRESSURE KIT) DEVI; 1 Device by Does not apply route as needed.  Dispense: 1 each; Refill: 0 - [facility]  BABYSCRIPTS SCHEDULE OPTIMIZATION  2. Intermittent lightheadedness Diet recall - is not eating well due to no appetite Discussed the importance of energy needs of her body - reviewed protein at every meal and minimeals more frequently during the day Advised to sit or lie down if she has a decreasing visual field and reviewed the risk of passing out if she does not have enough food to support the growing pregnancy.  3.  Round ligament pain especially on the right side Reassured client that this pain will resolved  4. COVID-19 Vaccine Counseling: The patient was counseled on the potential benefits and lack of known risks of COVID vaccination, during pregnancy and breastfeeding, during today's visit. The patient's questions and concerns were addressed today, including safety of the vaccination and potential side effects as they have been published by ACOG and [facility]. The patient has been informed that there have not been any documented vaccine related injuries, deaths or birth defects to infant or mom after receiving the COVID-19 vaccine to date. The patient has been made aware that although she is not at increased risk of contracting COVID-19 during pregnancy, she is at increased risk of developing severe disease and complications if she contracts COVID-19 while pregnant. All patient questions were addressed during our visit today. The patient is still unsure of her decision for vaccination.   Preterm labor symptoms and general obstetric precautions including but not limited to vaginal bleeding, contractions, leaking of fluid and fetal movement were reviewed in detail with the patient. Please refer to After Visit Summary for other counseling recommendations.  Return in about 4 weeks (around 09/10/2020) for in person ROB.  Earlie Server, RN, MSN, NP-BC Nurse Practitioner, [facility] for [employer], [employer] 08/13/2020 2:08 PM

## 2020-08-13 NOTE — Progress Notes (Signed)
Has not yet created [employer] account. Did not get bp rx.  BP cuff RX sent to [facility] for patient to pick up , and let us know if she has any issues. Declines flu vaccine. Jaquasha Carnevale,RN

## 2020-08-26 ENCOUNTER — Other Ambulatory Visit: Payer: Self-pay

## 2020-08-26 ENCOUNTER — Ambulatory Visit: Payer: Medicaid Other | Attending: Advanced Practice Midwife

## 2020-08-26 DIAGNOSIS — Z3401 Encounter for supervision of normal first pregnancy, first trimester: Secondary | ICD-10-CM | POA: Diagnosis not present

## 2020-08-26 IMAGING — US US MFM OB COMP +14 WKS
1 series · 14 of 28 positions shown · non-contrast
Comparison: none

[Series 1: us mfm ob comp +14 wks · 120 acquisitions, 14 frames shown]
[im 5/120]
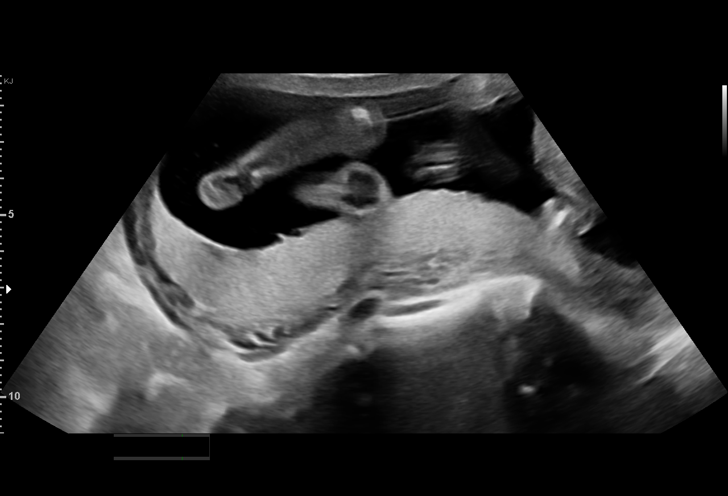
[im 14/120]
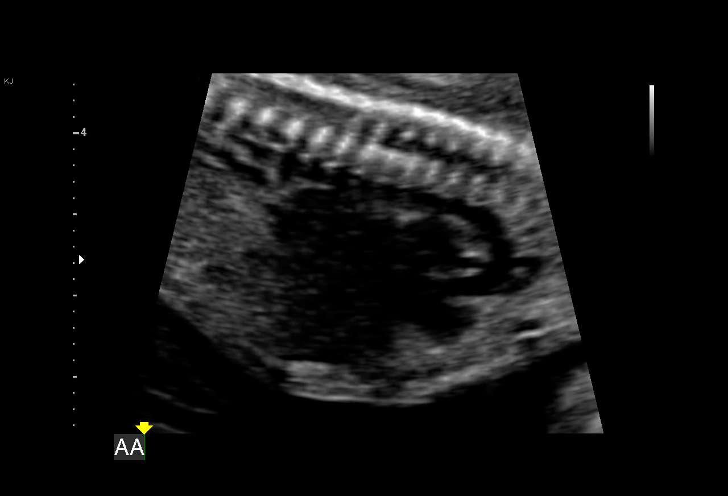
[im 23/120]
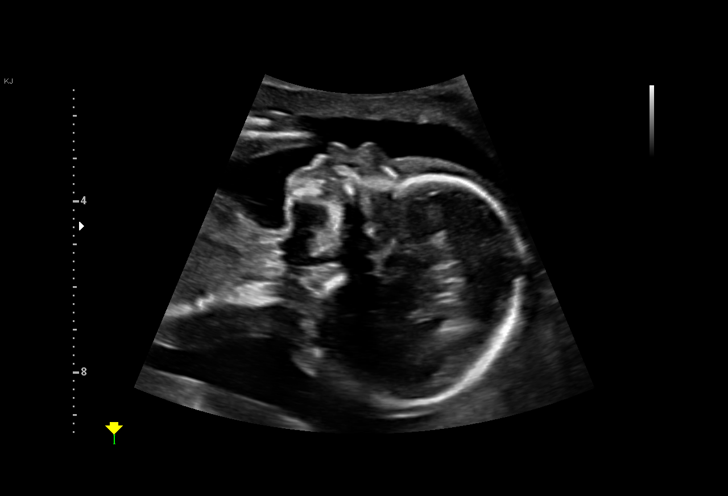
[im 31/120]
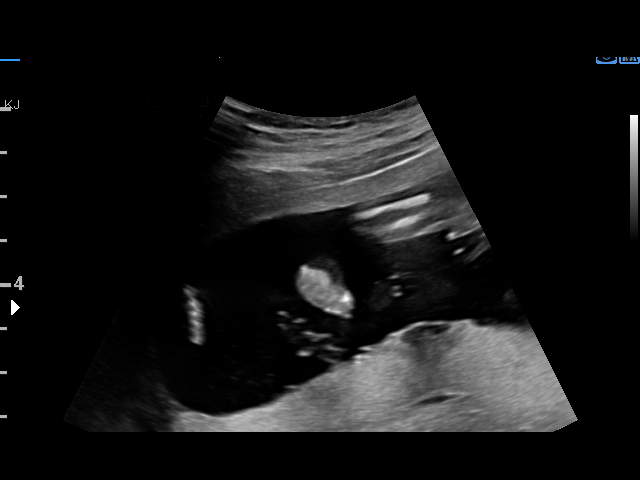
[im 40/120]
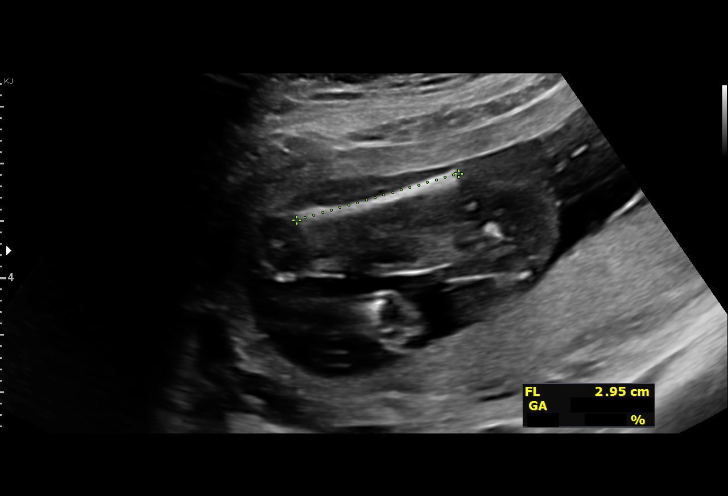
[im 49/120]
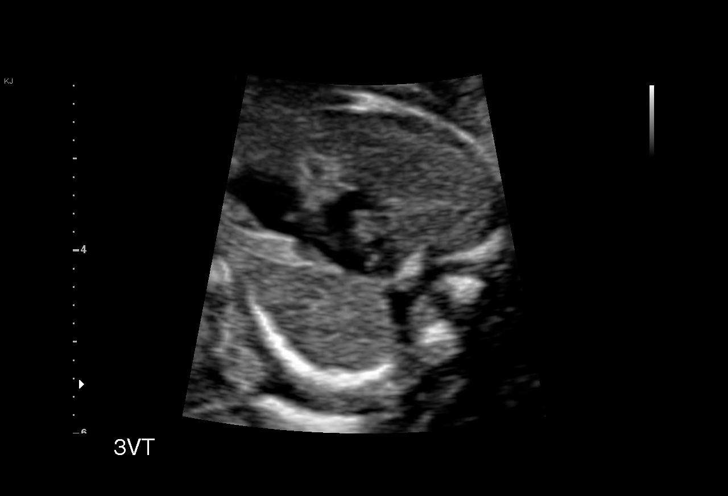
[im 58/120]
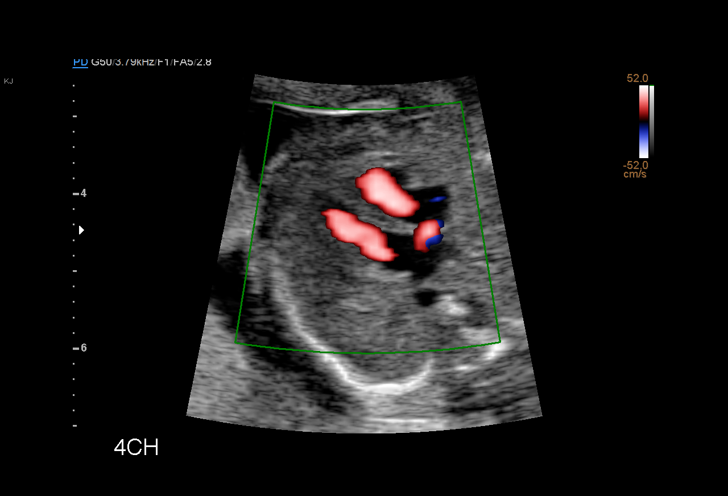
[im 67/120]
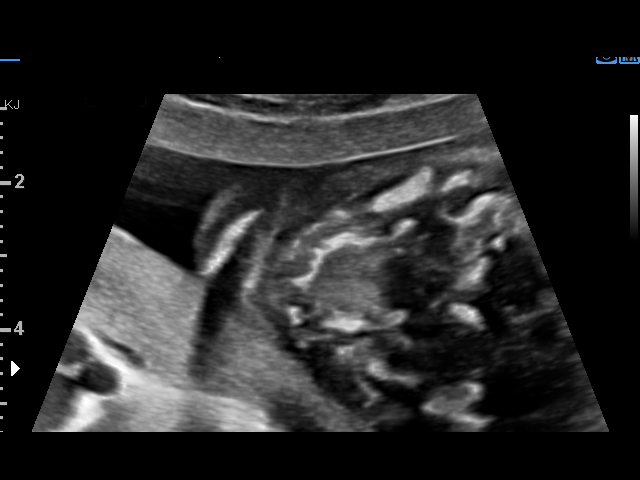
[im 75/120]
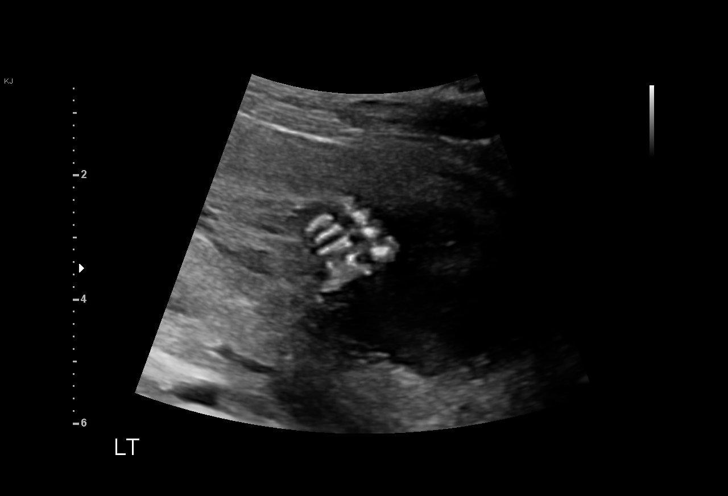
[im 84/120]
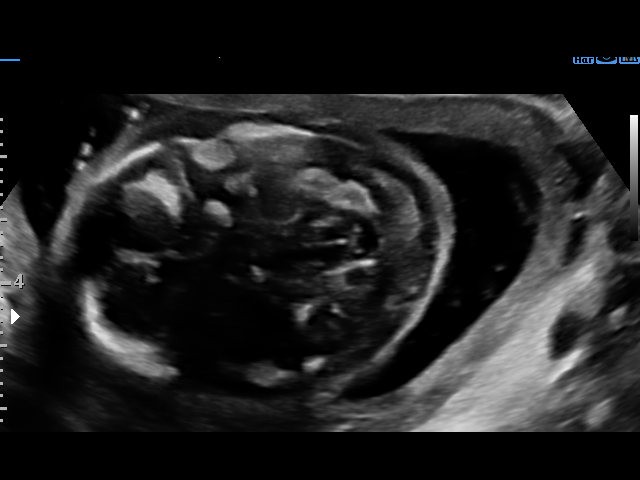
[im 93/120]
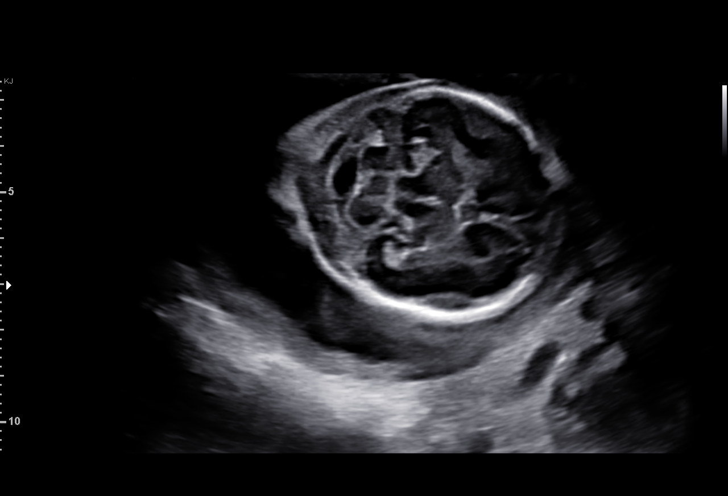
[im 102/120]
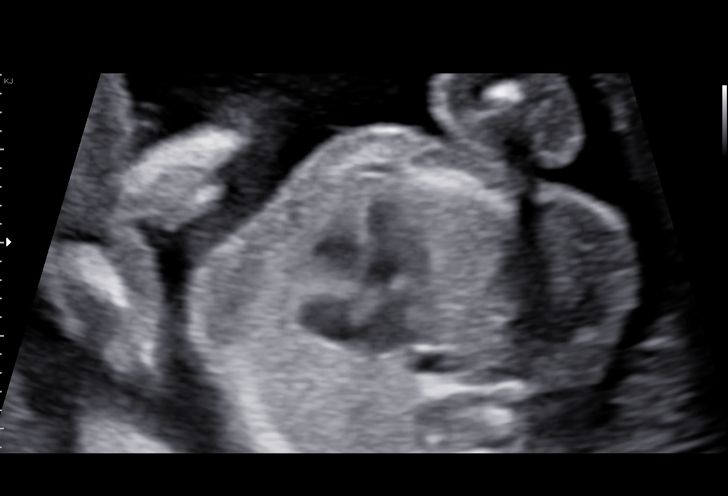
[im 111/120]
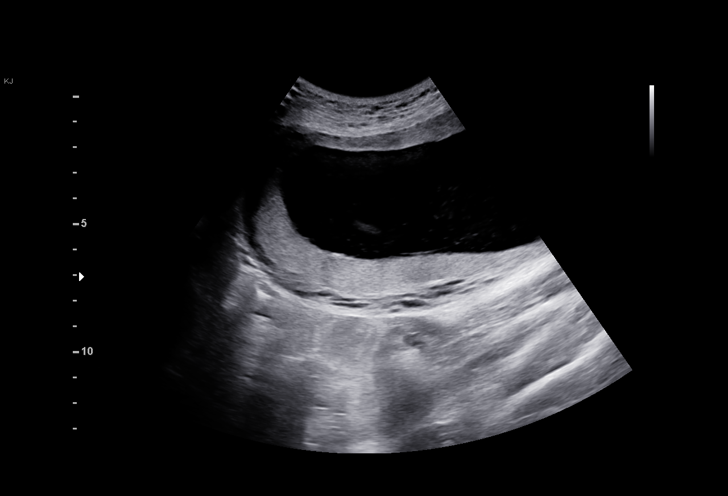
[im 120/120]
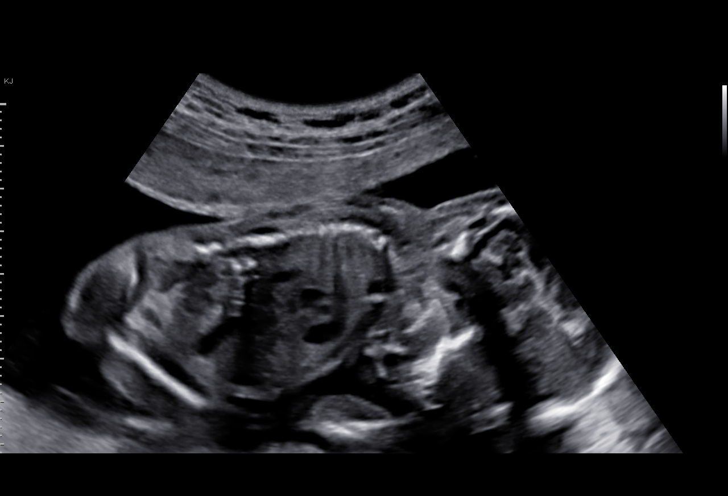

[14 of 28 positions shown; findings below may reference images not displayed]

AMAZIGH

                                                      AMAZIGH

Indications

 Fetal abnormality - other known or suspected   [GC]
 Low risk NIPS
 19 weeks gestation of pregnancy
Fetal Evaluation

 Num Of Fetuses:         1
 Fetal Heart Rate(bpm):  158
 Cardiac Activity:       Observed
 Presentation:           Cephalic
 Placenta:               Posterior
 P. Cord Insertion:      Visualized, central

 Amniotic Fluid
 AFI FV:      Within normal limits
Biometry

 BPD:      46.7  mm     G. Age:  20w 1d         82  %    CI:        74.22   %    70 - 86
                                                         FL/HC:      17.1   %    16.1 -
 HC:      172.1  mm     G. Age:  19w 5d         67  %    HC/AC:      1.22        1.09 -
 AC:      141.4  mm     G. Age:  19w 4d         53  %    FL/BPD:     63.0   %
 FL:       29.4  mm     G. Age:  19w 0d         34  %    FL/AC:      20.8   %    20 - 24
 HUM:      30.8  mm     G. Age:  20w 2d         75  %
 CER:      19.9  mm     G. Age:  19w 2d         47  %
 NFT:       3.9  mm
 LV:        6.5  mm
 CM:        3.5  mm

 Est. FW:     289  gm    0 lb 10 oz      51  %
OB History

 Gravidity:    1
Gestational Age

 LMP:           19w 2d        Date:  [DATE]                 EDD:   [DATE]
 U/S Today:     19w 4d                                        EDD:   [DATE]
 Best:          19w 2d     Det. By:  LMP  ([DATE])          EDD:   [DATE]
Anatomy

 Cranium:               Appears normal         LVOT:                   Appears normal
 Cavum:                 Appears normal         Aortic Arch:            Appears normal
 Ventricles:            Appears normal         Ductal Arch:            Appears normal
 Choroid Plexus:        Appears normal         Diaphragm:              Appears normal
 Cerebellum:            Appears normal         Stomach:                Appears normal, left
                                                                       sided
 Posterior Fossa:       Appears normal         Abdomen:                Appears normal
 Nuchal Fold:           Appears normal         Abdominal Wall:         Appears nml (cord
                                                                       insert, abd wall)
 Face:                  Appears normal         Cord Vessels:           Appears normal (3
                        (orbits and profile)                           vessel cord)
 Lips:                  Appears normal         Kidneys:                Appear normal
 Palate:                Appears normal         Bladder:                Appears normal
 Thoracic:              Appears normal         Spine:                  Appears normal
 Heart:                 Appears normal         Upper Extremities:      Appears normal
                        (4CH, axis, and
                        situs)
 RVOT:                  Appears normal         Lower Extremities:      Appears normal

 Other:  Fetus appears to be a male. Heels and 5th digit visualized. Nasal
         bone visualized. SVC IVC appears normal. 3VV/T appears normal.
Cervix Uterus Adnexa

 Cervix
 Length:           3.04  cm.
 Normal appearance by transabdominal scan.

 Right Ovary
 Within normal limits.

 Left Ovary
 Not visualized.

 Adnexa
 No abnormality visualized.
Comments

 This patient was seen for a detailed fetal anatomy scan.
 She denies any significant past medical history and denies
 any problems in her current pregnancy.
 She had a cell free DNA test earlier in her pregnancy which
 indicated a low risk for trisomy 21, 18, and 13. A male fetus is
 predicted.
 She was informed that the fetal growth and amniotic fluid
 level were appropriate for her gestational age.
 There were no obvious fetal anomalies noted on today's
 ultrasound exam.
 The patient was informed that anomalies may be missed due
 to technical limitations. If the fetus is in a suboptimal position
 or maternal habitus is increased, visualization of the fetus in
 the maternal uterus may be impaired.
 Follow up as indicated.

## 2020-09-13 ENCOUNTER — Encounter: Payer: Self-pay | Admitting: Medical

## 2020-09-13 ENCOUNTER — Telehealth (INDEPENDENT_AMBULATORY_CARE_PROVIDER_SITE_OTHER): Payer: Medicaid Other | Admitting: Medical

## 2020-09-13 ENCOUNTER — Encounter: Payer: Self-pay | Admitting: *Deleted

## 2020-09-13 VITALS — BP 136/84 | HR 85

## 2020-09-13 DIAGNOSIS — Z3401 Encounter for supervision of normal first pregnancy, first trimester: Secondary | ICD-10-CM | POA: Diagnosis not present

## 2020-09-13 DIAGNOSIS — Z3A21 21 weeks gestation of pregnancy: Secondary | ICD-10-CM

## 2020-09-13 NOTE — Patient Instructions (Addendum)
[email or web address] Second Trimester of Pregnancy  The second trimester of pregnancy is from week 13 through week 27. This is also called months 4 through 6 of pregnancy. This is often the time when you feel your best. During the second trimester:  Morning sickness is less or has stopped.  You may have more energy.  You may feel hungry more often. At this time, your unborn baby (fetus) is growing very fast. At the end of the sixth month, the unborn baby may be up to 12 inches long and weigh about 1 pounds. You will likely start to feel the baby move between 16 and 20 weeks of pregnancy. Body changes during your second trimester Your body continues to go through many changes during this time. The changes vary and generally return to normal after the baby is born. Physical changes  You will gain more weight.  You may start to get stretch marks on your hips, belly (abdomen), and breasts.  Your breasts will grow and may hurt.  Dark spots or blotches may develop on your face.  A dark line from your belly button to the pubic area (linea nigra) may appear.  You may have changes in your hair. Health changes  You may have headaches.  You may have heartburn.  You may have trouble pooping (constipation).  You may have hemorrhoids or swollen, bulging veins (varicose veins).  Your gums may bleed.  You may pee (urinate) more often.  You may have back pain. Follow these instructions at home: Medicines  Take over-the-counter and prescription medicines only as told by your doctor. Some medicines are not safe during pregnancy.  Take a prenatal vitamin that contains at least 600 micrograms (mcg) of folic acid. Eating and drinking  Eat healthy meals that include: ? Fresh fruits and vegetables. ? Whole grains. ? Good sources of protein, such as meat, eggs, or tofu. ? Low-fat dairy products.  Avoid raw meat and unpasteurized juice, milk, and cheese.  You may need to take  these actions to prevent or treat trouble pooping: ? Drink enough fluids to keep your pee (urine) pale yellow. ? Eat foods that are high in fiber. These include beans, whole grains, and fresh fruits and vegetables. ? Limit foods that are high in fat and sugar. These include fried or sweet foods. Activity  Exercise only as told by your doctor. Most people can do their usual exercise during pregnancy. Try to exercise for 30 minutes at least 5 days a week.  Stop exercising if you have pain or cramps in your belly or lower back.  Do not exercise if it is too hot or too humid, or if you are in a place of great height (high altitude).  Avoid heavy lifting.  If you choose to, you may have sex unless your doctor tells you not to. Relieving pain and discomfort  Wear a good support bra if your breasts are sore.  Take warm water baths (sitz baths) to soothe pain or discomfort caused by hemorrhoids. Use hemorrhoid cream if your doctor approves.  Rest with your legs raised (elevated) if you have leg cramps or low back pain.  If you develop bulging veins in your legs: ? Wear support hose as told by your doctor. ? Raise your feet for 15 minutes, 3-4 times a day. ? Limit salt in your food. Safety  Wear your seat belt at all times when you are in a car.  Talk with your doctor if someone is hurting you or  yelling at you a lot. Lifestyle  Do not use hot tubs, steam rooms, or saunas.  Do not douche. Do not use tampons or scented sanitary pads.  Avoid cat litter boxes and soil used by cats. These carry germs that can harm your baby and can cause a loss of your baby by miscarriage or stillbirth.  Do not use herbal medicines, illegal drugs, or medicines that are not approved by your doctor. Do not drink alcohol.  Do not smoke or use any products that contain nicotine or tobacco. If you need help quitting, ask your doctor. General instructions  Keep all follow-up visits. This is  important.  Ask your doctor about local prenatal classes.  Ask your doctor about the right foods to eat or for help finding a counselor. Where to find more information  American Pregnancy Association: americanpregnancy.org  [employer] of Obstetricians and Gynecologists: www.acog.org  Office on [employer] Health: MightyReward.co.nz Contact a doctor if:  You have a headache that does not go away when you take medicine.  You have changes in how you see, or you see spots in front of your eyes.  You have mild cramps, pressure, or pain in your lower belly.  You continue to feel like you may vomit (nauseous), you vomit, or you have watery poop (diarrhea).  You have bad-smelling fluid coming from your vagina.  You have pain when you pee or your pee smells bad.  You have very bad swelling of your face, hands, ankles, feet, or legs.  You have a fever. Get help right away if:  You are leaking fluid from your vagina.  You have spotting or bleeding from your vagina.  You have very bad belly cramping or pain.  You have trouble breathing.  You have chest pain.  You faint.  You have not felt your baby move for the time period told by your doctor.  You have new or increased pain, swelling, or redness in an arm or leg. Summary  The second trimester of pregnancy is from week 13 through week 27 (months 4 through 6).  Eat healthy meals.  Exercise as told by your doctor. Most people can do their usual exercise during pregnancy.  Do not use herbal medicines, illegal drugs, or medicines that are not approved by your doctor. Do not drink alcohol.  Call your doctor if you get sick or if you notice anything unusual about your pregnancy. This information is not intended to replace advice given to you by your health care provider. Make sure you discuss any questions you have with your health care provider. Document Revised: 12/06/2019 Document Reviewed: 10/12/2019 Elsevier  Patient Education  2021 Elsevier Inc.  AREA PEDIATRIC/FAMILY PRACTICE PHYSICIANS  Central/Southeast [place] ([address]) . [facility] Melodie Bouillon, MD; Lum Babe, MD; Sheffield Slider, MD; Leveda Anna, MD; McDiarmid, MD; Jerene Bears, MD; Jennette Kettle, MD; Gwendolyn Grant, MD o [address] [place] [place], [place] [address] o [phone] o Mon-Fri 8:30-12:30, 1:30-5:00 o Providers come to see babies at [facility] o Accepting Medicaid . [facility] o Limited providers who accept newborns: Docia Chuck, MD; Kateri Plummer, MD; Paulino Rily, MD o [address], [place], [place] [address] o [phone] o Mon-Fri 8:00-5:30 o Babies seen by providers at [facility] o Does NOT accept Medicaid o Please call early in hospitalization for appointment (limited availability)  . [facility] Fatima Sanger, MD o [address], [place], [place] [address] o [phone], Tue, Thur, Fri 8:30-5:00, Wed 10:00-7:00 (closed  1-2pm) o Babies seen by [facility] providers o Accepting Medicaid . Donnie Coffin - Pediatrician Fae Pippin, MD o [address], [place], [place] [address] o [phone] o Mon-Fri 8:30-5:00, Sat 8:30-12:00 o Provider comes to see babies at [facility] o Accepting Medicaid o Must have been referred from current patients or contacted office prior to delivery . [facility]) Leotis Pain, MD; Ave Filter, MD; Luna Fuse, MD; Kennedy Bucker, MD; Konrad Dolores, MD; Kathlene November, MD; Jenne Campus, MD; Lubertha South, MD; Wynetta Emery, MD; Duffy Rhody, MD; Gerre Couch, NP; Shirl Harris, NP o [address], [place], [place] [address] o [phone] o Mon, Tue, Thur, Fri 8:30-5:30, Wed 9:30-5:30, Sat 8:30-12:30 o Babies seen by [facility] providers o Accepting Medicaid o Only accepting infants of first-time parents or siblings of current patients [facility] discharge coordinator will make follow-up  appointment . Cyril Mourning o [address], [place], [place]  [address] o [phone]   Fax - [phone] . [facility] o [address], [place], [place]  [address] o Phone - [phone]   Fax - [phone] . Lucio Edward o [address], [place], [place]  [address] o [phone]  East/[place]ortheast [place] [phone]) . [facility] Jorge Mandril, MD; Alita Chyle, MD; Princella Ion, MD; MD; Earlene Plater, MD; Jamesetta Orleans, MD; Alvera Novel, MD; Clarene Duke, MD; Rana Snare, MD; Carmon Ginsberg, MD; Alinda Money, MD; Hosie Poisson, MD; Mayford Knife, MD o [address] [place], [place] [address] o [phone] o Mon-Fri 8:30-5:00 (extended evenings Mon-Thur as needed), Sat-Sun 10:00-1:00 o Providers come to see babies at [facility] o Accepting Medicaid for families of first-time babies and families with all children in the household age 32 and under. Must register with office prior to making appointment (M-F only). [facility] Odella Aquas, NP; Lynelle Doctor, MD; Susann Givens, MD; [place], [place] o [address] [place], [place] [address] o [phone] o Mon-Fri 8:00-5:00 o Babies seen by providers at [facility] o Does NOT accept Medicaid/Commercial Insurance Only . [facility] [place] [facility])  Suzette Battiest, MD; Zachery Dauer, MD; Stefan Church, MD; Sabino Dick, MD; Quitman Livings, MD; Farris Has, MD; Gaynell Face, MD; Betha Loa, MD; Colon Flattery, MD; Clifton James, MD o [address] [place] [place], [place] [address] o [phone] o Mon-Fri 8:30-5:30, Sat (Oct.-Mar.) 9:00-1:00 o Babies seen by providers at [facility] o Accepting [facility] [phone]) . [facility] Dimitri, MD; Sheliah Hatch, MD o [address], [place], [place] [address] o [phone] o Mon-Fri 8:30-5:00, Sat 8:30-12:00 o Providers come to see babies at [facility] o Does NOT accept Medicaid . [facility] Cindy Hazy, [place]; Radisson, MD; [place], [place]; Wynelle Link, MD;  Azucena Cecil, MD o [address] [place], [place] [address] o [phone] o Mon-Fri 8:00-5:00 o Babies seen by providers at [facility] o Does NOT accept Medicaid o Only accepting babies of parents who are patients o Please call early in hospitalization for appointment (limited availability) . [facility]ians Lamar Benes, MD; Abran Cantor, MD; Early Osmond, MD; Cherre Huger, NP; Hyacinth Meeker, MD; Dwan Bolt, MD; Jarold Motto, NP; Dario Guardian, MD; Talmage Nap, MD; Maisie Fus, MD; Pricilla Holm, MD; Tama High, MD o [address], [place], [place] [address] o [phone] o Mon-Fri 8:00-5:00, Sat 9:00-12:00 o Providers come to see babies at [facility] o Does NOT accept [facility] [phone]) . [facility] at [facility] o Limited providers accepting new patients: Drema Pry, NP; Delena Serve, [place] o [address], [place], [place] [address] o [phone] o Mon-Fri 8:00-5:00 o Babies seen by providers at [facility] o Does NOT accept Medicaid o Only accepting babies of parents who are patients o Please call early in hospitalization for appointment (limited availability) . [facility] Luan Pullingo Gay, MD; Nash DimmerQuinlan, MD o [address] [place], [place] [address] o [phone] (press 1 to schedule appointment) o Mon-Fri 8:00-5:00 o Providers come to see babies at [facility] o Does NOT accept Medicaid . [facility] Cristino Marteso Mazer, MD o [address] [place], [place] [address] o [phone] o Mon-Fri 8:30-5:00 (lunch 12:30-1:00), extended hours by appointment only Wed 5:00-6:30 o Babies seen by [facility] providers o Accepting Medicaid . [facility] o Banks, MD; SwazilandJordan, MD; Hassan RowanKoberlein, MD o [address], [place], [place] [address] o [phone] o Mon-Fri 8:00-5:00 o Babies seen by [facility] providers o Does NOT accept Medicaid . [facility] conservation [facility] [address] [place] Sohoo Parker, MD; Durene CalHunter, MD; AtkinsWallace, DO o [address] [place], [place] [address] o [phone] o Mon-Fri 8:00-5:00 o Babies seen by [facility] providers o Does NOT accept Medicaid . [facility] o [place]andon, [place]; ColumbineBrecken, [place]; Stellahristy, NP; Avis Epleyees, MD; Vonna KotykeClaire, MD; Clance BolleWeese, MD; Stevphen RochesterHansen, NP; Arvilla MarketMills, NP; Ann MakiParrish, NP; Otis DialsSmoot, NP; Vaughan BastaSummer, MD; Charlotte HarborVapne, MD o [address] [place], [place] [address] o [phone] o Mon-Fri 8:30-5:00, Sat 10:00-1:00 o Providers come to see babies at [facility] o Does NOT accept Medicaid o Free prenatal information session Tuesdays at 4:45pm . [facility] Luna Kitchenso Bouska, MD; CollingdaleGordon, [place]PA; Arrow RockJeffery, [place]; Weber, [place] o [address] [place] [address] o [phone] o Mon-Fri 7:30-5:30 o Babies seen by [facility] providers . [facility] o [address], [place], [place]  [address] o [phone]   Fax - [phone] [place] [phone] & [phone]) . [facility] Alphonsa Overallo Reese, MD o [address] [place], [place] [address] o [phone] o Mon-Thur 8:00-6:00 o Providers come to see babies at [facility] o Accepting Medicaid . [facility] Zenon Mayoo Anderson, NP; Cyndia BentBadger, MD; GouldBeal, [place]; RuckersvilleSpencer, [place] o [address] [place], [place] [address] o [phone] o Mon-Thur 7:30-7:30, Fri 7:30-4:30 o Babies seen by [facility] providers o Accepting Medicaid . [facility] Cheryle Horsfallo Agbuya, MD; Janene HarveyKlett, NP; Vonita Mossomgoolam, MD o [address], [place], [place] [address] o [phone] o Mon-Fri 8:30-5:00, Sat 8:30-12:00 o Providers come to see babies at [facility] o Accepting Medicaid o Must have "Meet & Greet" appointment at office prior to delivery . [facility] [place] [facility] [place]) Llana Alimento McCord, MD; Earlene PlaterWallace, MD; Lucretia RoersWood, MD o [address], [place], [place] [address] o [phone] o Mon-Wed 8:00-6:00, Thur-Fri 8:00-5:00, Sat  9:00-12:00 o Providers come to see babies at [facility] o Does NOT accept Medicaid o Only accepting siblings of current patients . [facility]  o [address], [place], [place]  [address] o [phone]   Fax - [phone] . [facility] Jeanette o [phone] [address], [place], [place]  [address] o [phone]   Fax - [phone]  [place]/Southwest  [phone] & [phone]) . Nature conservation officerLeBauer [facility] at [facility] o Fayettevilleirigliano, DO; SomervilleMatthews, DO o [address] [place], [place] [address] o [phone] o Mon-Fri 7:00-5:00 o Babies seen by [facility] providers o Does NOT accept Medicaid . [facility]  Ellis Savage, MD; [place], [place]; [place], [place] o [address], [place], [place] [address] o [phone] o Mon-Fri 8:00-5:00 o Babies seen by [facility] providers o Accepting Medicaid . [facility] - [address] Franne Forts, MD; [place], [place]; [place], NP; [place], [place] o [address] [place], [place], [place] [address] o [phone] o Mon-Fri 8:00-5:00 o Babies seen by providers at [facility] o Accepting [facility] [phone]) . [facility] [place], [place] o [address] [place], [place] [address] o [phone] o Mon-Fri 8:00-5:00 o Babies seen by [facility] providers o Does NOT accept Medicaid o Limited availability, please call early in hospitalization to schedule follow-up . [facility] Jolee Ewing, PA; Eddie Candle, MD; [place], MD; [place], [place]; Constance Goltz, MD; [place], [place] o [facility] [facility] [address], [place], [place] [address] o [phone] o Mon-Fri 8:30-5:00, Sat 9:00-12:00 o Babies seen by providers at [facility] o Accepting Medicaid o Please register online then schedule online or call office o [email or web address] . [facility]  [facility]) Samuella Bruin, NP; Lucianne Muss, MD; Lanier Clam, PA o [address], [place], [place] [address] o [phone] o Mon-Fri 8:00-5:00 o Babies seen by providers at [facility] o Accepting Medicaid . [facility] - [facility] ([facility] [employer]) Sharin Mons, MD; Reed Breech, NP; Shelva Majestic, MD o [address], [place], [place] [address] o [phone] o Mon-Fri 8:00-5:30, Sat&Sun by appointment (phones open at 8:30) o Babies seen by [facility] providers o Accepting Medicaid o Must be a first-time baby or sibling of current patient . [facility] [address], [place], [place]  [address] o [phone]   Fax - [phone]  [place] [phone] & [phone]) . [facility] o [place], [place]; [place], [place]; Dimple Casey, MD; Pablo, [place]; Carolyne Fiscal, MD o [address] [place], [place] [address] o [phone] o Mon-Thur 8:00-7:00, Fri 8:00-5:00, Sat 8:00-12:00, Sun 9:00-12:00 o Babies seen by [facility] providers o Accepting Medicaid . [facility] at [facility], MD; Gaynell Face, MD; [facility], MD o [address], [place], [place] [address] o [phone] o Mon-Thur 8:00-5:00 o Babies seen by providers at [facility] o Accepting Medicaid . [facility] at [place] Gwenlyn Saran, MD; Coe-Goins, MD; Madilyn Fireman, MD; Melvyn Neth, MD; List, MD; Lazarus Salines, MD; Gaynell Face, MD; Berneda Rose, MD; Flora Lipps, MD; Beryl Meager, MD; Luther Redo, MD; Lavonia Drafts, MD; Kellie Simmering, MD o [address] [place], [place] [address] o [phone] o Mon-Fri 8:00-5:30, Sat (Oct.-Mar.) 9:00-1:00 o Babies seen by providers at [facility] o Accepting Medicaid o Must fill out new patient packet, available online at [email or web address] . [facility] - [place] [facility]) Simone Curia, NP;  Tiburcio Pea, NP; Tresa Endo, NP; Whitney Post, MD; [place], [place]; Hennie Duos, MD; Wynne Dust, MD; Kavin Leech, NP o [address], [place], [place] [address] o [phone] o Mon-Thur 8:00-5:30, Fri 8:00-5:00 o Babies seen by providers at [facility] o Accepting [facility] [phone]) . [facility] o No Name, [place]; [place], MD; Tanya Nones, MD; [place], [place] o [address] [place], [place] [address] o [phone] o Mon-Fri 8:00-5:00 o Babies seen by providers at [facility] o Accepting [facility] [phone]) . [facility] o [place], DO; Lenise Arena, MD; [place], [place] o [address], [place], [place] [address] o [phone] o Mon-Fri 8:00-5:00 o Babies seen by providers at [facility] o Does NOT accept Medicaid o Limited appointment availability, please call early in hospitalization  . Nature conservation officer at [facility] o Simpson, DO; [place], MD o [address] [place], [place] [address] o [phone] o Mon-Fri 8:00-5:00 o Babies seen by [facility] providers o Does NOT accept Medicaid . [facility] Lorrine Kin, MD; Ninetta Lights, MD; [place], [place]; Lyons, MD o [address], [place], [place] [address] o [phone] o Mon-Fri 8:00-5:00 o After hours clinic [facility] [place], [place] [address]) [phone] Mon-Fri 5:00-8:00, Sat 12:00-6:00, Sun 10:00-4:00 o Babies seen by [facility] providers o Accepting Medicaid . [facility] o [address] [place], [place]  [address] o [phone]   Fax - [phone]  [place] [phone]) . Nature conservation officer at [facility], MD o [address] [place], [place], [place] [address] o [phone] o Mon-Fri 8:00-5:00 o Babies seen by [facility] providers o Does NOT accept Medicaid . [facility] - [place] [facility] at [place]) Tomi Likens, MD o [address] [place] [address] [place], [place] [address] o [phone] o Mon-Thur 8:00-7:00, Fri 8:00-5:00, Sat 8:00-12:00 o Babies seen by providers at [facility] o Accepting Medicaid - but does not have vaccinations in office (must be received elsewhere) o Limited availability, please call early in hospitalization  [place] ([phone]) . [facility]  o Wyvonne Lenz, MD o [address] [place] [address] o [phone]  Fax [phone]

## 2020-09-13 NOTE — Progress Notes (Signed)
I connected with Sydney Rice 09/13/20 at 10:55 AM EST by: MyChart video and verified that I am speaking with the correct person using two identifiers.  Patient is located at home and provider is located at [facility].     The purpose of this virtual visit is to provide medical care while limiting exposure to the novel coronavirus. I discussed the limitations, risks, security and privacy concerns of performing an evaluation and management service by MyChart video and the availability of in person appointments. I also discussed with the patient that there may be a patient responsible charge related to this service. By engaging in this virtual visit, you consent to the provision of healthcare.  Additionally, you authorize for your insurance to be billed for the services provided during this visit.  The patient expressed understanding and agreed to proceed.  The following staff members participated in the virtual visit:  Marylynn Pearson, RN    PRENATAL VISIT NOTE  Subjective:  Sydney Rice is a 25 y.o. G1P0000 at [redacted]w[redacted]d  for phone visit for ongoing prenatal care.  She is currently monitored for the following issues for this low-risk pregnancy and has Chlamydia infection affecting pregnancy and Encounter for supervision of normal first pregnancy in first trimester on their problem list.  Patient reports no complaints.  Contractions: Not present. Vag. Bleeding: None.  Movement: Present. Denies leaking of fluid.   The following portions of the patient's history were reviewed and updated as appropriate: allergies, current medications, past family history, past medical history, past social history, past surgical history and problem list.   Objective:   Vitals:   09/13/20 1105  BP: 136/84  Pulse: 85   Self-Obtained  Fetal Status:     Movement: Present     Assessment and Plan:  Pregnancy: G1P0000 at [redacted]w[redacted]d 1. Encounter for supervision of normal first pregnancy in first trimester -  Normal anatomy US reviewed - MOC undecided - will consider  - Peds list given and importance of choosing prior to 35 weeks discussed  - Planning IP Circ  2. [redacted] weeks gestation of pregnancy  Preterm labor symptoms and general obstetric precautions including but not limited to vaginal bleeding, contractions, leaking of fluid and fetal movement were reviewed in detail with the patient.  Return in about 4 weeks (around 10/11/2020) for LOB, Virtual.  Future Appointments  Date Time Provider Department Center  10/11/2020  9:35 AM Magnus Sinning [facility]     Time spent on virtual visit: 10 minutes  Vonzella Nipple, PA-C

## 2020-10-02 ENCOUNTER — Telehealth: Payer: Self-pay | Admitting: Lactation Services

## 2020-10-02 ENCOUNTER — Inpatient Hospital Stay (HOSPITAL_COMMUNITY)
Admission: AD | Admit: 2020-10-02 | Discharge: 2020-10-02 | Disposition: A | Discharge status: Home / Self Care | Payer: Medicaid Other | Attending: Obstetrics & Gynecology | Admitting: Obstetrics & Gynecology

## 2020-10-02 ENCOUNTER — Encounter (HOSPITAL_COMMUNITY): Payer: Self-pay | Admitting: Obstetrics & Gynecology

## 2020-10-02 ENCOUNTER — Other Ambulatory Visit: Payer: Self-pay

## 2020-10-02 DIAGNOSIS — O26892 Other specified pregnancy related conditions, second trimester: Secondary | ICD-10-CM | POA: Diagnosis not present

## 2020-10-02 DIAGNOSIS — Z3A24 24 weeks gestation of pregnancy: Secondary | ICD-10-CM | POA: Insufficient documentation

## 2020-10-02 DIAGNOSIS — B373 Candidiasis of vulva and vagina: Secondary | ICD-10-CM

## 2020-10-02 DIAGNOSIS — Z0371 Encounter for suspected problem with amniotic cavity and membrane ruled out: Secondary | ICD-10-CM | POA: Diagnosis not present

## 2020-10-02 DIAGNOSIS — B3731 Acute candidiasis of vulva and vagina: Secondary | ICD-10-CM

## 2020-10-02 DIAGNOSIS — R109 Unspecified abdominal pain: Secondary | ICD-10-CM | POA: Insufficient documentation

## 2020-10-02 DIAGNOSIS — O23592 Infection of other part of genital tract in pregnancy, second trimester: Secondary | ICD-10-CM | POA: Insufficient documentation

## 2020-10-02 DIAGNOSIS — O98812 Other maternal infectious and parasitic diseases complicating pregnancy, second trimester: Secondary | ICD-10-CM

## 2020-10-02 HISTORY — DX: Other specified health status: Z78.9

## 2020-10-02 LAB — URINALYSIS, ROUTINE W REFLEX MICROSCOPIC
Bilirubin Urine: NEGATIVE
Glucose, UA: NEGATIVE mg/dL
Hgb urine dipstick: NEGATIVE
Ketones, ur: 80 mg/dL — AB
Nitrite: NEGATIVE
Protein, ur: NEGATIVE mg/dL
Specific Gravity, Urine: 1.012 (ref 1.005–1.030)
pH: 6 (ref 5.0–8.0)

## 2020-10-02 LAB — WET PREP, GENITAL
Clue Cells Wet Prep HPF POC: NONE SEEN
Sperm: NONE SEEN
Trich, Wet Prep: NONE SEEN

## 2020-10-02 LAB — POCT FERN TEST: POCT Fern Test: NEGATIVE

## 2020-10-02 MED ORDER — TERCONAZOLE 0.4 % VA CREA: 1.0000 | TOPICAL_CREAM | Freq: Every day | VAGINAL | 0 refills | Status: DC

## 2020-10-02 NOTE — MAU Provider Note (Signed)
History     CSN: [id number]  Arrival date and time: 10/02/20 3662   Event Date/Time   First Provider Initiated Contact with Patient 10/02/20 2026      Chief Complaint  Patient presents with  . Abdominal Pain   HPI Sydney Rice is a 25 y.o. G1P0000 at 11w4dwho presents with leaking fluid and abdominal cramping. Symptoms started yesterday. Reports trickling watery fluid. Has had some irritation of her labia. Intermittent lower abdominal cramping that doesn't occur very frequently; not hourly. Rates pain 3/10. Doesn't treat pain. Nothing makes better or worse. Denies vaginal bleeding, dysuria, or recent intercourse. Reports good fetal movement.   OB History    Gravida  1   Para  0   Term  0   Preterm  0   AB  0   Living  0     SAB  0   IAB  0   Ectopic  0   Multiple  0   Live Births  0           Past Medical History:  Diagnosis Date  . Medical history non-contributory     Past Surgical History:  Procedure Laterality Date  . NO PAST SURGERIES      History reviewed. No pertinent family history.  Social History   Tobacco Use  . Smoking status: Never Smoker  . Smokeless tobacco: Never Used  Vaping Use  . Vaping Use: Never used  Substance Use Topics  . Alcohol use: Not Currently  . Drug use: Not Currently    Types: Marijuana    Allergies: No Known Allergies  Medications Prior to Admission  Medication Sig Dispense Refill Last Dose  . acetaminophen (TYLENOL) 500 MG tablet Take 500 mg by mouth every 6 (six) hours as needed.   Past Week at Unknown time  . aspirin EC 81 MG tablet Take 1 tablet (81 mg total) by mouth daily. Take after 12 weeks for prevention of preeclampsia later in pregnancy 300 tablet 2 10/01/2020 at Unknown time  . Prenatal Vit-Fe Fumarate-FA (PRENATAL MULTIVITAMIN) TABS tablet Take 1 tablet by mouth daily at 12 noon.   10/01/2020 at Unknown time  . Blood Pressure Monitoring (BLOOD PRESSURE KIT) DEVI 1 Device by Does not  apply route as needed. 1 each 0     Review of Systems  Constitutional: Negative.   Gastrointestinal: Positive for abdominal pain. Negative for diarrhea, nausea and vomiting.  Genitourinary: Positive for vaginal discharge. Negative for dysuria and vaginal bleeding.   Physical Exam   Blood pressure 124/69, pulse (!) 101, temperature 98.2 F (36.8 C), temperature source Oral, resp. rate 17, height _0  (1.549 m), weight 54.5 kg, last menstrual period 04/13/2020, SpO2 100 %.  Physical Exam Vitals and nursing note reviewed. Exam conducted with a chaperone present.  Constitutional:      General: She is not in acute distress.    Appearance: She is well-developed and normal weight.  HENT:     Head: Normocephalic and atraumatic.  Pulmonary:     Effort: Pulmonary effort is normal. No respiratory distress.  Abdominal:     Palpations: Abdomen is soft.  Genitourinary:    General: Normal vulva.     Exam position: Lithotomy position.     Cervix: Discharge present. No friability or cervical bleeding.     Comments: Moderate amount of clumpy white discharge adherent to cervix & vaginal walls. No pooling of fluid. No blood.   Dilation: Closed Effacement (%): Thick Cervical Position:  Posterior Station: Ballotable Exam by:: Jorje Guild NP  Neurological:     Mental Status: She is alert.    NST:  Baseline: 150 bpm, Variability: Good {> 6 bpm), Accelerations: Non-reactive but appropriate for gestational age and Decelerations: Absent  MAU Course  Procedures Results for orders placed or performed during the hospital encounter of 10/02/20 (from the past 24 hour(s))  Urinalysis, Routine w reflex microscopic Urine, Clean Catch     Status: Abnormal   Collection Time: 10/02/20  8:16 PM  Result Value Ref Range   Color, Urine YELLOW YELLOW   APPearance CLEAR CLEAR   Specific Gravity, Urine 1.012 1.005 - 1.030   pH 6.0 5.0 - 8.0   Glucose, UA NEGATIVE NEGATIVE mg/dL   Hgb urine dipstick NEGATIVE  NEGATIVE   Bilirubin Urine NEGATIVE NEGATIVE   Ketones, ur 80 (A) NEGATIVE mg/dL   Protein, ur NEGATIVE NEGATIVE mg/dL   Nitrite NEGATIVE NEGATIVE   Leukocytes,Ua LARGE (A) NEGATIVE   RBC / HPF 0-5 0 - 5 RBC/hpf   WBC, UA 6-10 0 - 5 WBC/hpf   Bacteria, UA MANY (A) NONE SEEN   Squamous Epithelial / LPF 0-5 0 - 5   Mucus PRESENT   Wet prep, genital     Status: Abnormal   Collection Time: 10/02/20  8:26 PM   Specimen: Cervix  Result Value Ref Range   Yeast Wet Prep HPF POC PRESENT (A) NONE SEEN   Trich, Wet Prep NONE SEEN NONE SEEN   Clue Cells Wet Prep HPF POC NONE SEEN NONE SEEN   WBC, Wet Prep HPF POC MANY (A) NONE SEEN   Sperm NONE SEEN   POCT fern test     Status: Normal   Collection Time: 10/02/20  8:48 PM  Result Value Ref Range   POCT Fern Test Negative = intact amniotic membranes     MDM Patient presents with 2 days history of vaginal discharge concerned for PPROM. Sterile speculum exam performed; discharge consistent with vaginal yeast infection. No evidence of SROM and fern negative. Reports some irregular cramping; cervix closed/thick with some irregular ctx on monitor.   U/a with large leuks. No urinary complaints. Will send urine for culture.    Assessment and Plan   1. Yeast vaginitis   2. Encounter for suspected PROM, with rupture of membranes not found   3. [redacted] weeks gestation of pregnancy    -Rx terazol -GC/CT & urine culture pending -reviewed reasons to return to [place] 10/02/2020, 9:12 PM

## 2020-10-02 NOTE — Telephone Encounter (Signed)
Returned patients call. Patient did not answer. LM for her to call the office at her earliest convenience.

## 2020-10-02 NOTE — MAU Note (Signed)
..  Sydney Rice is a 25 y.o. at [redacted]w[redacted]d here in MAU reporting: Yesterday morning she a gush of fluid and has continues to have some small gushes through today. She describes the fluid as light mucous/yellow. She has worn a pad and changes it every 2 hours but it does not get soaked. +FM She also has abdominal pain that feels like cramps and some tightening.   Pain score: 3/10 Vitals:   10/02/20 1959  BP: 124/69  Pulse: (!) 101  Resp: 17  Temp: 98.2 F (36.8 C)  SpO2: 100%     FHT: 150 by doppler Lab orders placed from triage: UA

## 2020-10-02 NOTE — Discharge Instructions (Signed)
Vaginal Yeast Infection, Adult  Vaginal yeast infection is a condition that causes vaginal discharge as well as soreness, swelling, and redness (inflammation) of the vagina. This is a common condition. Some women get this infection frequently. What are the causes? This condition is caused by a change in the normal balance of the yeast (candida) and bacteria that live in the vagina. This change causes an overgrowth of yeast, which causes the inflammation. What increases the risk? The condition is more likely to develop in women who:  Take antibiotic medicines.  Have diabetes.  Take birth control pills.  Are pregnant.  Douche often.  Have a weak body defense system (immune system).  Have been taking steroid medicines for a long time.  Frequently wear tight clothing. What are the signs or symptoms? Symptoms of this condition include:  White, thick, creamy vaginal discharge.  Swelling, itching, redness, and irritation of the vagina. The lips of the vagina (vulva) may be affected as well.  Pain or a burning feeling while urinating.  Pain during sex. How is this diagnosed? This condition is diagnosed based on:  Your medical history.  A physical exam.  A pelvic exam. Your health care provider will examine a sample of your vaginal discharge under a microscope. Your health care provider may send this sample for testing to confirm the diagnosis. How is this treated? This condition is treated with medicine. Medicines may be over-the-counter or prescription. You may be told to use one or more of the following:  Medicine that is taken by mouth (orally).  Medicine that is applied as a cream (topically).  Medicine that is inserted directly into the vagina (suppository). Follow these instructions at home: Lifestyle  Do not have sex until your health care provider approves. Tell your sex partner that you have a yeast infection. That person should go to his or her health care  provider and ask if they should also be treated.  Do not wear tight clothes, such as pantyhose or tight pants.  Wear breathable cotton underwear. General instructions  Take or apply over-the-counter and prescription medicines only as told by your health care provider.  Eat more yogurt. This may help to keep your yeast infection from returning.  Do not use tampons until your health care provider approves.  Try taking a sitz bath to help with discomfort. This is a warm water bath that is taken while you are sitting down. The water should only come up to your hips and should cover your buttocks. Do this 3-4 times per day or as told by your health care provider.  Do not douche.  If you have diabetes, keep your blood sugar levels under control.  Keep all follow-up visits as told by your health care provider. This is important.   Contact a health care provider if:  You have a fever.  Your symptoms go away and then return.  Your symptoms do not get better with treatment.  Your symptoms get worse.  You have new symptoms.  You develop blisters in or around your vagina.  You have blood coming from your vagina and it is not your menstrual period.  You develop pain in your abdomen. Summary  Vaginal yeast infection is a condition that causes discharge as well as soreness, swelling, and redness (inflammation) of the vagina.  This condition is treated with medicine. Medicines may be over-the-counter or prescription.  Take or apply over-the-counter and prescription medicines only as told by your health care provider.  Do not   douche. Do not have sex or use tampons until your health care provider approves.  Contact a health care provider if your symptoms do not get better with treatment or your symptoms go away and then return. This information is not intended to replace advice given to you by your health care provider. Make sure you discuss any questions you have with your health care  provider. Document Revised: 01/27/2019 Document Reviewed: 11/15/2017 Elsevier Patient Education  2021 Elsevier Inc.  

## 2020-10-03 LAB — GC/CHLAMYDIA PROBE AMP (~~LOC~~) NOT AT ARMC
Chlamydia: NEGATIVE
Comment: NEGATIVE
Comment: NORMAL
Neisseria Gonorrhea: NEGATIVE

## 2020-10-03 NOTE — Telephone Encounter (Signed)
Called pt; VM left with callback number. Stated pt may call or MyChart message if she still has concerns. Per chart review, pt was seen in MAU yesterday.

## 2020-10-04 LAB — CULTURE, OB URINE: Special Requests: NORMAL

## 2020-10-11 ENCOUNTER — Encounter: Payer: Self-pay | Admitting: Medical

## 2020-10-11 ENCOUNTER — Other Ambulatory Visit: Payer: Self-pay

## 2020-10-11 ENCOUNTER — Telehealth (INDEPENDENT_AMBULATORY_CARE_PROVIDER_SITE_OTHER): Payer: Medicaid Other | Admitting: Medical

## 2020-10-11 VITALS — BP 115/77 | HR 81

## 2020-10-11 DIAGNOSIS — O26892 Other specified pregnancy related conditions, second trimester: Secondary | ICD-10-CM

## 2020-10-11 DIAGNOSIS — M549 Dorsalgia, unspecified: Secondary | ICD-10-CM

## 2020-10-11 DIAGNOSIS — O99512 Diseases of the respiratory system complicating pregnancy, second trimester: Secondary | ICD-10-CM

## 2020-10-11 DIAGNOSIS — Z3401 Encounter for supervision of normal first pregnancy, first trimester: Secondary | ICD-10-CM

## 2020-10-11 DIAGNOSIS — Z3A25 25 weeks gestation of pregnancy: Secondary | ICD-10-CM

## 2020-10-11 DIAGNOSIS — J302 Other seasonal allergic rhinitis: Secondary | ICD-10-CM

## 2020-10-11 NOTE — Progress Notes (Signed)
  I connected with Sydney Rice 10/11/20 at  9:35 AM EDT by: MyChart video and verified that I am speaking with the correct person using two identifiers.  Patient is located at home and provider is located at [facility].     The purpose of this virtual visit is to provide medical care while limiting exposure to the novel coronavirus. I discussed the limitations, risks, security and privacy concerns of performing an evaluation and management service by MyChart video and the availability of in person appointments. I also discussed with the patient that there may be a patient responsible charge related to this service. By engaging in this virtual visit, you consent to the provision of healthcare.  Additionally, you authorize for your insurance to be billed for the services provided during this visit.  The patient expressed understanding and agreed to proceed.  The following staff members participated in the virtual visit:  Raynald Blend, RN    PRENATAL VISIT NOTE  Subjective:  Sydney Rice is a 25 y.o. G1P0000 at [redacted]w[redacted]d  for phone visit for ongoing prenatal care.  She is currently monitored for the following issues for this low-risk pregnancy and has Chlamydia infection affecting pregnancy and Encounter for supervision of normal first pregnancy in first trimester on their problem list.  Patient reports backache.  Contractions: Not present. Vag. Bleeding: None.  Movement: Present. Denies leaking of fluid.   The following portions of the patient's history were reviewed and updated as appropriate: allergies, current medications, past family history, past medical history, past social history, past surgical history and problem list.   Objective:   Vitals:   10/11/20 1000  BP: 115/77  Pulse: 81   Self-Obtained  Fetal Status:     Movement: Present     Assessment and Plan:  Pregnancy: G1P0000 at [redacted]w[redacted]d 1. Encounter for supervision of normal first pregnancy in first trimester - Still  undecided on MOC, discussed IP LARC options  - Discussed importance of choosing Peds, has list  - Anticipatory guidance for future visits discussed, GTT, CBC, HIV, RPR and TDAP at next visit   2. [redacted] weeks gestation of pregnancy  3. Back pain in pregnancy, second trimester  - Discussed Tylenol, abdominal binder, hydrotherapy and heat therapy for management   4. Seasonal allergies - List of OTC medications safe in pregnancy given   Preterm labor symptoms and general obstetric precautions including but not limited to vaginal bleeding, contractions, leaking of fluid and fetal movement were reviewed in detail with the patient.  Return in about 2 weeks (around 10/25/2020) for LOB, 28 week labs (fasting), In-Person.  No future appointments.   Time spent on virtual visit: 15 minutes  Vonzella Nipple, PA-C

## 2020-10-11 NOTE — Progress Notes (Addendum)
I connected with  Truitt Leep on 10/11/20 at  9:35 AM EDT by MyChart Video visit and verified that I am speaking with the correct person using two identifiers.   I discussed the limitations, risks, security and privacy concerns of performing an evaluation and management service by telephone and the availability of in person appointments. I also discussed with the patient that there may be a patient responsible charge related to this service. The patient expressed understanding and agreed to proceed.  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  She c/o back pain. We discussed using tylenol and maternity support belt. She is active in [employer] and took her bp while online with nurse.  Lenae Wherley,RN 10/11/2020  9:58 AM

## 2020-10-11 NOTE — Patient Instructions (Addendum)
Back Pain in Pregnancy Back pain during pregnancy is common. Back pain may be caused by several factors that are related to changes during your pregnancy. Follow these instructions at home: Managing pain, stiffness, and swelling  If directed, for sudden (acute) back pain, put ice on the painful area. ? Put ice in a plastic bag. ? Place a towel between your skin and the bag. ? Leave the ice on for 20 minutes, 2-3 times per day.  If directed, apply heat to the affected area before you exercise. Use the heat source that your health care provider recommends, such as a moist heat pack or a heating pad. ? Place a towel between your skin and the heat source. ? Leave the heat on for 20-30 minutes. ? Remove the heat if your skin turns bright red. This is especially important if you are unable to feel pain, heat, or cold. You may have a greater risk of getting burned.  If directed, massage the affected area.      Activity  Exercise as told by your health care provider. Gentle exercise is the best way to prevent or manage back pain.  Listen to your body when lifting. If lifting hurts, ask for help or bend your knees. This uses your leg muscles instead of your back muscles.  Squat down when picking up something from the floor. Do not bend over.  Only use bed rest for short periods as told by your health care provider. Bed rest should only be used for the most severe episodes of back pain. Standing, sitting, and lying down  Do not stand in one place for long periods of time.  Use good posture when sitting. Make sure your head rests over your shoulders and is not hanging forward. Use a pillow on your lower back if necessary.  Try sleeping on your side, preferably the left side, with a pregnancy support pillow or 1-2 regular pillows between your legs. ? If you have back pain after a night's rest, your bed may be too soft. ? A firm mattress may provide more support for your back during  pregnancy. General instructions  Do not wear high heels.  Eat a healthy diet. Try to gain weight within your health care provider's recommendations.  Use a maternity girdle, elastic sling, or back brace as told by your health care provider.  Take over-the-counter and prescription medicines only as told by your health care provider.  Work with a physical therapist or massage therapist to find ways to manage back pain. Acupuncture or massage therapy may be helpful.  Keep all follow-up visits as told by your health care provider. This is important. Contact a health care provider if:  Your back pain interferes with your daily activities.  You have increasing pain in other parts of your body. Get help right away if:  You develop numbness, tingling, weakness, or problems with the use of your arms or legs.  You develop severe back pain that is not controlled with medicine.  You have a change in bowel or bladder control.  You develop shortness of breath, dizziness, or you faint.  You develop nausea, vomiting, or sweating.  You have back pain that is a rhythmic, cramping pain similar to labor pains. Labor pain is usually 1-2 minutes apart, lasts for about 1 minute, and involves a bearing down feeling or pressure in your pelvis.  You have back pain and your water breaks or you have vaginal bleeding.  You have back pain or   numbness that travels down your leg.  Your back pain developed after you fell.  You develop pain on one side of your back.  You see blood in your urine.  You develop skin blisters in the area of your back pain. Summary  Back pain may be caused by several factors that are related to changes during your pregnancy.  Follow instructions as told by your health care provider for managing pain, stiffness, and swelling.  Exercise as told by your health care provider. Gentle exercise is the best way to prevent or manage back pain.  Take over-the-counter and  prescription medicines only as told by your health care provider.  Keep all follow-up visits as told by your health care provider. This is important. This information is not intended to replace advice given to you by your health care provider. Make sure you discuss any questions you have with your health care provider. Document Revised: 10/18/2018 Document Reviewed: 12/15/2017 Elsevier Patient Education  2021 Elsevier Inc.  Safe Medications in Pregnancy   Acne:  Benzoyl Peroxide  Salicylic Acid   Backache/Headache:  Tylenol: 2 regular strength every 4 hours OR        2 Extra strength every 6 hours   Colds/Coughs/Allergies:  Benadryl (alcohol free) 25 mg every 6 hours as needed  Breath right strips  Claritin  Cepacol throat lozenges  Chloraseptic throat spray  Cold-Eeze- up to three times per day  Cough drops, alcohol free  Flonase (by prescription only)  Guaifenesin  Mucinex  Robitussin DM (plain only, alcohol free)  Saline nasal spray/drops  Sudafed (pseudoephedrine) & Actifed * use only after [redacted] weeks gestation and if you do not have high blood pressure  Tylenol  Vicks Vaporub  Zinc lozenges  Zyrtec   Constipation:  Colace  Ducolax suppositories  Fleet enema  Glycerin suppositories  Metamucil  Milk of magnesia  Miralax  Senokot  Smooth move tea   Diarrhea:  Kaopectate  Imodium A-D   *NO pepto Bismol   Hemorrhoids:  Anusol  Anusol HC  Preparation H  Tucks   Indigestion:  Tums  Maalox  Mylanta  Zantac  Pepcid   Insomnia:  Benadryl (alcohol free) 25mg  every 6 hours as needed  Tylenol PM  Unisom, no Gelcaps   Leg Cramps:  Tums  MagGel   Nausea/Vomiting:  Bonine  Dramamine  Emetrol  Ginger extract  Sea bands  Meclizine  Nausea medication to take during pregnancy:  Unisom (doxylamine succinate 25 mg tablets) Take one tablet daily at bedtime. If symptoms are not adequately controlled, the dose can be increased to a maximum  recommended dose of two tablets daily (1/2 tablet in the morning, 1/2 tablet mid-afternoon and one at bedtime).  Vitamin B6 100mg  tablets. Take one tablet twice a day (up to 200 mg per day).   Skin Rashes:  Aveeno products  Benadryl cream or 25mg  every 6 hours as needed  Calamine Lotion  1% cortisone cream   Yeast infection:  Gyne-lotrimin 7  Monistat 7    **If taking multiple medications, please check labels to avoid duplicating the same active ingredients  **take medication as directed on the label  ** Do not exceed 4000 mg of tylenol in 24 hours  **Do not take medications that contain aspirin or ibuprofen

## 2020-10-22 ENCOUNTER — Other Ambulatory Visit: Payer: Self-pay | Admitting: *Deleted

## 2020-10-22 DIAGNOSIS — Z3400 Encounter for supervision of normal first pregnancy, unspecified trimester: Secondary | ICD-10-CM

## 2020-10-28 ENCOUNTER — Other Ambulatory Visit: Payer: Self-pay

## 2020-10-28 ENCOUNTER — Other Ambulatory Visit: Payer: Medicaid Other

## 2020-10-28 ENCOUNTER — Ambulatory Visit (INDEPENDENT_AMBULATORY_CARE_PROVIDER_SITE_OTHER): Payer: Medicaid Other | Admitting: Obstetrics and Gynecology

## 2020-10-28 VITALS — BP 126/80 | HR 70

## 2020-10-28 DIAGNOSIS — Z23 Encounter for immunization: Secondary | ICD-10-CM

## 2020-10-28 DIAGNOSIS — Z3A28 28 weeks gestation of pregnancy: Secondary | ICD-10-CM

## 2020-10-28 DIAGNOSIS — Z3400 Encounter for supervision of normal first pregnancy, unspecified trimester: Secondary | ICD-10-CM

## 2020-10-28 DIAGNOSIS — Z3480 Encounter for supervision of other normal pregnancy, unspecified trimester: Secondary | ICD-10-CM

## 2020-10-28 NOTE — Progress Notes (Signed)
   PRENATAL VISIT NOTE  Subjective:  Sydney Rice is a 25 y.o. G1P0000 at [redacted]w[redacted]d being seen today for ongoing prenatal care.  She is currently monitored for the following issues for this low-risk pregnancy and has Chlamydia infection affecting pregnancy and Supervision of other normal pregnancy, antepartum on their problem list.  Patient reports b/l LE edema, occasional low back pain.  Contractions: Not present. Vag. Bleeding: None.  Movement: Present. Denies leaking of fluid.   The following portions of the patient's history were reviewed and updated as appropriate: allergies, current medications, past family history, past medical history, past social history, past surgical history and problem list.   Objective:   Vitals:   10/28/20 0943  BP: 126/80  Pulse: 70    Fetal Status: Fetal Heart Rate (bpm): 145   Movement: Present     General:  Alert, oriented and cooperative. Patient is in no acute distress.  Skin: Skin is warm and dry. No rash noted.   Cardiovascular: Normal heart rate noted  Respiratory: Normal respiratory effort, no problems with respiration noted  Abdomen: Soft, gravid, appropriate for gestational age.  Pain/Pressure: Present     Pelvic: Cervical exam deferred        Extremities: Normal range of motion.  Edema: Trace  Mental Status: Normal mood and affect. Normal behavior. Normal judgment and thought content.   Assessment and Plan:  Pregnancy: G1P0000 at [redacted]w[redacted]d 1. [redacted] weeks gestation of pregnancy Routine care. TED hoses and pregnancy belt advised. 28wk labs today. tdap given  2. Supervision of other normal pregnancy, antepartum  Preterm labor symptoms and general obstetric precautions including but not limited to vaginal bleeding, contractions, leaking of fluid and fetal movement were reviewed in detail with the patient. Please refer to After Visit Summary for other counseling recommendations.   Return in about 2 weeks (around 11/11/2020) for 2-3wk, low risk,  md or app, in person.  Future Appointments  Date Time Provider Department Center  10/28/2020 10:35 AM [place] Bing, MD [facility]    [place] Bing, MD

## 2020-10-28 NOTE — Progress Notes (Signed)
Home Medicaid Form completed  

## 2020-10-28 NOTE — Addendum Note (Signed)
Addended by: Faythe Casa on: 10/28/2020 10:00 AM   Modules accepted: Orders

## 2020-10-29 ENCOUNTER — Encounter: Payer: Self-pay | Admitting: General Practice

## 2020-10-29 ENCOUNTER — Encounter: Payer: Self-pay | Admitting: Obstetrics and Gynecology

## 2020-10-29 ENCOUNTER — Other Ambulatory Visit: Payer: Self-pay | Admitting: Obstetrics and Gynecology

## 2020-10-29 DIAGNOSIS — O99013 Anemia complicating pregnancy, third trimester: Secondary | ICD-10-CM

## 2020-10-29 DIAGNOSIS — O99019 Anemia complicating pregnancy, unspecified trimester: Secondary | ICD-10-CM | POA: Insufficient documentation

## 2020-10-29 LAB — CBC
Hematocrit: 28.4 % — ABNORMAL LOW (ref 34.0–46.6)
Hemoglobin: 9.6 g/dL — ABNORMAL LOW (ref 11.1–15.9)
MCH: 30.8 pg (ref 26.6–33.0)
MCHC: 33.8 g/dL (ref 31.5–35.7)
MCV: 91 fL (ref 79–97)
Platelets: 236 10*3/uL (ref 150–450)
RBC: 3.12 x10E6/uL — ABNORMAL LOW (ref 3.77–5.28)
RDW: 12 % (ref 11.7–15.4)
WBC: 9 10*3/uL (ref 3.4–10.8)

## 2020-10-29 LAB — GLUCOSE TOLERANCE, 2 HOURS W/ 1HR
Glucose, 1 hour: 126 mg/dL (ref 65–179)
Glucose, 2 hour: 92 mg/dL (ref 65–152)
Glucose, Fasting: 74 mg/dL (ref 65–91)

## 2020-10-29 LAB — RPR: RPR Ser Ql: NONREACTIVE

## 2020-10-29 LAB — HIV ANTIBODY (ROUTINE TESTING W REFLEX): HIV Screen 4th Generation wRfx: NONREACTIVE

## 2020-10-30 ENCOUNTER — Other Ambulatory Visit: Payer: Self-pay | Admitting: Obstetrics and Gynecology

## 2020-10-30 ENCOUNTER — Telehealth: Payer: Self-pay | Admitting: Lactation Services

## 2020-10-30 DIAGNOSIS — O99013 Anemia complicating pregnancy, third trimester: Secondary | ICD-10-CM

## 2020-10-30 LAB — ANEMIA PROFILE B
Basophils Absolute: 0 10*3/uL (ref 0.0–0.2)
Basos: 0 %
EOS (ABSOLUTE): 0.2 10*3/uL (ref 0.0–0.4)
Eos: 3 %
Ferritin: 10 ng/mL — ABNORMAL LOW (ref 15–150)
Folate: 12.6 ng/mL (ref >3.0)
Hematocrit: 30.5 % — ABNORMAL LOW (ref 34.0–46.6)
Hemoglobin: 9.8 g/dL — ABNORMAL LOW (ref 11.1–15.9)
Immature Grans (Abs): 0.1 10*3/uL (ref 0.0–0.1)
Immature Granulocytes: 1 %
Iron Saturation: 8 % — CL (ref 15–55)
Iron: 38 ug/dL (ref 27–159)
Lymphocytes Absolute: 1.7 10*3/uL (ref 0.7–3.1)
Lymphs: 18 %
MCH: 30.5 pg (ref 26.6–33.0)
MCHC: 32.1 g/dL (ref 31.5–35.7)
MCV: 95 fL (ref 79–97)
Monocytes Absolute: 0.6 10*3/uL (ref 0.1–0.9)
Monocytes: 7 %
Neutrophils Absolute: 6.5 10*3/uL (ref 1.4–7.0)
Neutrophils: 71 %
Platelets: 235 10*3/uL (ref 150–450)
RBC: 3.21 x10E6/uL — ABNORMAL LOW (ref 3.77–5.28)
RDW: 12.1 % (ref 11.7–15.4)
Retic Ct Pct: 2.1 % (ref 0.6–2.6)
Total Iron Binding Capacity: 450 ug/dL (ref 250–450)
UIBC: 412 ug/dL (ref 131–425)
Vitamin B-12: 277 pg/mL (ref 232–1245)
WBC: 9.1 10*3/uL (ref 3.4–10.8)

## 2020-10-30 LAB — SPECIMEN STATUS REPORT

## 2020-10-30 NOTE — Telephone Encounter (Signed)
Called and scheduled Venofer Infusion for 5/4 at 9 am. Patient will need to be at the infusion center for 5 hours.   Called patient to inform her of lab results and appointment for Venofer infusion. She did not answer. LM for her to call the office at [phone] for lab results and appointment information.   My Chart message sent.

## 2020-10-30 NOTE — Telephone Encounter (Signed)
-----   Message from [place] Bing, MD sent at 10/30/2020  7:54 AM EDT ----- Needs iv iron qwk x 2 wks. Orders are in. thanks

## 2020-11-11 ENCOUNTER — Ambulatory Visit (INDEPENDENT_AMBULATORY_CARE_PROVIDER_SITE_OTHER): Payer: Medicaid Other | Admitting: Obstetrics and Gynecology

## 2020-11-11 VITALS — BP 113/75 | HR 85 | Wt 125.7 lb

## 2020-11-11 DIAGNOSIS — O0930 Supervision of pregnancy with insufficient antenatal care, unspecified trimester: Secondary | ICD-10-CM | POA: Insufficient documentation

## 2020-11-11 DIAGNOSIS — Z3480 Encounter for supervision of other normal pregnancy, unspecified trimester: Secondary | ICD-10-CM

## 2020-11-11 DIAGNOSIS — Z3A30 30 weeks gestation of pregnancy: Secondary | ICD-10-CM | POA: Insufficient documentation

## 2020-11-11 DIAGNOSIS — O99013 Anemia complicating pregnancy, third trimester: Secondary | ICD-10-CM

## 2020-11-11 NOTE — Progress Notes (Signed)
   PRENATAL VISIT NOTE  Subjective:  Sydney Rice is a 25 y.o. G1P0000 at [redacted]w[redacted]d being seen today for ongoing prenatal care.  She is currently monitored for the following issues for this low-risk pregnancy and has Chlamydia infection affecting pregnancy; Supervision of other normal pregnancy, antepartum; Anemia in pregnancy; and [redacted] weeks gestation of pregnancy on their problem list.  Patient doing well with no acute concerns today. She reports no complaints.  Contractions: Not present. Vag. Bleeding: None.  Movement: Present. Denies leaking of fluid.   Pt has iron infusion in 2 days  The following portions of the patient's history were reviewed and updated as appropriate: allergies, current medications, past family history, past medical history, past social history, past surgical history and problem list. Problem list updated.  Objective:   Vitals:   11/11/20 1113  BP: 113/75  Pulse: 85  Weight: 125 lb 11.2 oz (57 kg)    Fetal Status: Fetal Heart Rate (bpm): 147 Fundal Height: 30 cm Movement: Present     General:  Alert, oriented and cooperative. Patient is in no acute distress.  Skin: Skin is warm and dry. No rash noted.   Cardiovascular: Normal heart rate noted  Respiratory: Normal respiratory effort, no problems with respiration noted  Abdomen: Soft, gravid, appropriate for gestational age.  Pain/Pressure: Present     Pelvic: Cervical exam deferred        Extremities: Normal range of motion.  Edema: Trace  Mental Status:  Normal mood and affect. Normal behavior. Normal judgment and thought content.   Assessment and Plan:  Pregnancy: G1P0000 at [redacted]w[redacted]d  1. [redacted] weeks gestation of pregnancy   2. Supervision of other normal pregnancy, antepartum Routine care  3. Anemia during pregnancy in third trimester Iron infusion in 2 days  Preterm labor symptoms and general obstetric precautions including but not limited to vaginal bleeding, contractions, leaking of fluid and  fetal movement were reviewed in detail with the patient.  Please refer to After Visit Summary for other counseling recommendations.   Return in about 2 weeks (around 11/25/2020) for [facility], in person.   Mariel Aloe, MD Faculty Attending [facility]

## 2020-11-12 NOTE — Discharge Instructions (Signed)

## 2020-11-13 ENCOUNTER — Encounter (HOSPITAL_COMMUNITY)
Admission: RE | Admit: 2020-11-13 | Discharge: 2020-11-13 | Disposition: A | Discharge status: Home / Self Care | Payer: Medicaid Other | Source: Ambulatory Visit | Attending: Obstetrics and Gynecology | Admitting: Obstetrics and Gynecology

## 2020-11-13 ENCOUNTER — Other Ambulatory Visit: Payer: Self-pay

## 2020-11-13 DIAGNOSIS — O99013 Anemia complicating pregnancy, third trimester: Secondary | ICD-10-CM | POA: Diagnosis not present

## 2020-11-13 MED ORDER — SODIUM CHLORIDE 0.9 % IV SOLN
500.0000 mg | INTRAVENOUS | Status: DC
  Administered 2020-11-13: 500 mg via INTRAVENOUS
  Filled 2020-11-13: qty 25

## 2020-11-13 MED ORDER — ACETAMINOPHEN 500 MG PO TABS
1000.0000 mg | ORAL_TABLET | Freq: Once | ORAL | Status: AC
  Administered 2020-11-13: 1000 mg via ORAL

## 2020-11-13 MED ORDER — ONDANSETRON HCL 4 MG/2ML IJ SOLN: 4.0000 mg | Freq: Four times a day (QID) | INTRAMUSCULAR | Status: DC | PRN

## 2020-11-13 MED ORDER — ONDANSETRON HCL 4 MG/2ML IJ SOLN
INTRAMUSCULAR | Status: AC
  Administered 2020-11-13: 4 mg via INTRAVENOUS
  Filled 2020-11-13: qty 2

## 2020-11-13 MED ORDER — EPINEPHRINE 0.3 MG/0.3ML IJ SOAJ
0.3000 mg | Freq: Once | INTRAMUSCULAR | Status: AC
  Administered 2020-11-13: 0.3 mg via INTRAMUSCULAR
  Filled 2020-11-13: qty 0.3

## 2020-11-13 MED ORDER — SODIUM CHLORIDE 0.9 % IV SOLN
Freq: Once | INTRAVENOUS | Status: AC
  Administered 2020-11-13: 1000 mL via INTRAVENOUS

## 2020-11-13 MED ORDER — ACETAMINOPHEN 500 MG PO TABS
ORAL_TABLET | ORAL | Status: AC
  Filled 2020-11-13: qty 2

## 2020-11-13 NOTE — Progress Notes (Signed)
Pt here today for first dose of IV venofer 500mg .  Infused per order over 4 hours.  Pt pressed her callbell  Complaining of low back pain that she said just started.  At the same time, the venofer beeped as it had completed infusing.  Pt then complained of chills, nausea, began vomiting, tightness in her chest on inhalation, noted swelling in both hands and feet, hives noted on back, arms, and face. [place], RN called rapid response for OB and Dr placed orders for NS bolus, epi pen, and zofran 4mg  IV.   FHR noted to be 150.  Dr Ashok Pall came and evalutated patient and stated to monitor her for an hour.  If she's feeling better at that time may DC home.  VS q 15 min done.  1500 pt stated she has no more nausea, chest pain is just on one side when she breathes in, continues to have chills and or tremors possibly from the epi pen, VS stable,  Swelling in hands and feet appear to have gone down, hives appear better, warm blankets given to patient.  Monitored patient for an hour plus.   1545 Pt denies any chest pain, denies nausea, hives, swelling in hands and feet appear better, pt states still having some low back pain and requested tylenol.  Order received for ES tylenol, and okay to DC pt home as someone is coming to drive her home.  Pt advised to seek medical care if she begins to have anymore nausea and vomiting, any dizziness, pain in back gets worse,or if swelling appears worse, or any new symptoms begin.  Pt  Verbalized understanding.  Pt DC home with family and taken to car via wheelchair.

## 2020-11-13 NOTE — Progress Notes (Signed)
Here for Venofer infusion for iron deficiency anemia of pregnancy. Just at end of transfusion patient developed chest tightness, nausea, hives, and nausea. No wheezing/sob, cyanosis. Vitals wnl. This appears to be a moderate hypersensitivity reaction. Given epinephrine, 1 L fluids, and zofran. Vitals monitored, no hypotension or hypoxia.  Symptoms resolved after 1+ hours observation. A friend/family member will escort her home. Respiratory return precautions reviewed.

## 2020-11-20 ENCOUNTER — Encounter (HOSPITAL_COMMUNITY): Payer: Medicaid Other

## 2020-11-25 ENCOUNTER — Other Ambulatory Visit: Payer: Self-pay

## 2020-11-25 ENCOUNTER — Other Ambulatory Visit (HOSPITAL_COMMUNITY)
Admission: RE | Admit: 2020-11-25 | Discharge: 2020-11-25 | Disposition: A | Discharge status: Home / Self Care | Payer: Medicaid Other | Source: Ambulatory Visit | Attending: Obstetrics and Gynecology | Admitting: Obstetrics and Gynecology

## 2020-11-25 ENCOUNTER — Ambulatory Visit (INDEPENDENT_AMBULATORY_CARE_PROVIDER_SITE_OTHER): Payer: Medicaid Other | Admitting: Obstetrics and Gynecology

## 2020-11-25 ENCOUNTER — Encounter: Payer: Self-pay | Admitting: Obstetrics and Gynecology

## 2020-11-25 VITALS — BP 121/75 | HR 102 | Wt 127.8 lb

## 2020-11-25 DIAGNOSIS — Z3480 Encounter for supervision of other normal pregnancy, unspecified trimester: Secondary | ICD-10-CM | POA: Diagnosis present

## 2020-11-25 DIAGNOSIS — O99013 Anemia complicating pregnancy, third trimester: Secondary | ICD-10-CM

## 2020-11-25 DIAGNOSIS — Z3A32 32 weeks gestation of pregnancy: Secondary | ICD-10-CM

## 2020-11-25 MED ORDER — FERROUS SULFATE 325 (65 FE) MG PO TABS: 325.0000 mg | ORAL_TABLET | ORAL | 3 refills | Status: DC

## 2020-11-25 NOTE — Progress Notes (Signed)
Patient complains of vaginal irritation that started last week. Patient states that it is "itching" but denies any odor.    Patient also complains of lack of appetite and "numbing sensation" when stand on feet for long periods of time

## 2020-11-25 NOTE — Progress Notes (Signed)
Subjective:  Margarett Viti is a 25 y.o. G1P0000 at [redacted]w[redacted]d being seen today for ongoing prenatal care.  She is currently monitored for the following issues for this low-risk pregnancy and has Supervision of other normal pregnancy, antepartum and Anemia in pregnancy on their problem list.  Patient reports general discomforts of pregnancy.  Contractions: Irritability.  .  Movement: Present. Denies leaking of fluid.   The following portions of the patient's history were reviewed and updated as appropriate: allergies, current medications, past family history, past medical history, past social history, past surgical history and problem list. Problem list updated.  Objective:   Vitals:   11/25/20 1121  BP: 121/75  Pulse: (!) 102  Weight: 58 kg    Fetal Status: Fetal Heart Rate (bpm): 151   Movement: Present     General:  Alert, oriented and cooperative. Patient is in no acute distress.  Skin: Skin is warm and dry. No rash noted.   Cardiovascular: Normal heart rate noted  Respiratory: Normal respiratory effort, no problems with respiration noted  Abdomen: Soft, gravid, appropriate for gestational age. Pain/Pressure: Absent     Pelvic:  Cervical exam deferred        Extremities: Normal range of motion.  Edema: Trace  Mental Status: Normal mood and affect. Normal behavior. Normal judgment and thought content.   Urinalysis:      Assessment and Plan:  Pregnancy: G1P0000 at [redacted]w[redacted]d  1. Supervision of other normal pregnancy, antepartum Stable - Cervicovaginal ancillary only( [place])  2. Anemia during pregnancy in third trimester Stable, s/p IV infusion, ? Reaction. QOD oral iron supplement  Preterm labor symptoms and general obstetric precautions including but not limited to vaginal bleeding, contractions, leaking of fluid and fetal movement were reviewed in detail with the patient. Please refer to After Visit Summary for other counseling recommendations.  Return in about 2 weeks  (around 12/09/2020) for OB visit, face to face, MD only.   Hermina Staggers, MD

## 2020-11-26 LAB — CERVICOVAGINAL ANCILLARY ONLY
Bacterial Vaginitis (gardnerella): NEGATIVE
Candida Glabrata: NEGATIVE
Candida Vaginitis: POSITIVE — AB
Comment: NEGATIVE
Comment: NEGATIVE
Comment: NEGATIVE

## 2020-11-27 ENCOUNTER — Other Ambulatory Visit: Payer: Self-pay

## 2020-11-27 DIAGNOSIS — B379 Candidiasis, unspecified: Secondary | ICD-10-CM

## 2020-11-27 MED ORDER — TERCONAZOLE 0.4 % VA CREA: 1.0000 | TOPICAL_CREAM | Freq: Every day | VAGINAL | 0 refills | Status: DC

## 2020-11-27 NOTE — Progress Notes (Signed)
Please send in Rx for Terazol for yeast infection  Pt is aware.  Thanks  Casimiro Needle

## 2020-12-10 ENCOUNTER — Encounter: Payer: Self-pay | Admitting: Family Medicine

## 2020-12-10 ENCOUNTER — Ambulatory Visit (INDEPENDENT_AMBULATORY_CARE_PROVIDER_SITE_OTHER): Payer: Medicaid Other | Admitting: Family Medicine

## 2020-12-10 ENCOUNTER — Other Ambulatory Visit: Payer: Self-pay

## 2020-12-10 VITALS — BP 122/78 | HR 98 | Wt 130.0 lb

## 2020-12-10 DIAGNOSIS — Z3480 Encounter for supervision of other normal pregnancy, unspecified trimester: Secondary | ICD-10-CM

## 2020-12-10 DIAGNOSIS — O99013 Anemia complicating pregnancy, third trimester: Secondary | ICD-10-CM

## 2020-12-10 NOTE — Progress Notes (Signed)
   Subjective:  Sydney Rice is a 25 y.o. G1P0000 at [redacted]w[redacted]d being seen today for ongoing prenatal care.  She is currently monitored for the following issues for this low-risk pregnancy and has Supervision of other normal pregnancy, antepartum and Anemia in pregnancy on their problem list.  Patient reports no complaints.  Contractions: Irritability. Vag. Bleeding: None.  Movement: Present. Denies leaking of fluid.   The following portions of the patient's history were reviewed and updated as appropriate: allergies, current medications, past family history, past medical history, past social history, past surgical history and problem list. Problem list updated.  Objective:   Vitals:   12/10/20 1322  BP: 122/78  Pulse: 98  Weight: 130 lb (59 kg)    Fetal Status: Fetal Heart Rate (bpm): 143 Fundal Height: 35 cm Movement: Present     General:  Alert, oriented and cooperative. Patient is in no acute distress.  Skin: Skin is warm and dry. No rash noted.   Cardiovascular: Normal heart rate noted  Respiratory: Normal respiratory effort, no problems with respiration noted  Abdomen: Soft, gravid, appropriate for gestational age. Pain/Pressure: Present     Pelvic: Vag. Bleeding: None     Cervical exam deferred        Extremities: Normal range of motion.  Edema: Trace  Mental Status: Normal mood and affect. Normal behavior. Normal judgment and thought content.   Urinalysis:      Assessment and Plan:  Pregnancy: G1P0000 at [redacted]w[redacted]d  1. Supervision of other normal pregnancy, antepartum BP and FHR normal Discussed contraception, still deciding, referred to bedsider  2. Anemia during pregnancy in third trimester Last hgb 9 but low iron/ferritin studies On every other day iron  Preterm labor symptoms and general obstetric precautions including but not limited to vaginal bleeding, contractions, leaking of fluid and fetal movement were reviewed in detail with the patient. Please refer to  After Visit Summary for other counseling recommendations.  Return in 2 weeks (on 12/24/2020) for [facility], ob visit.   Venora Maples, MD

## 2020-12-10 NOTE — Patient Instructions (Signed)
 Contraception Choices Contraception, also called birth control, refers to methods or devices that prevent pregnancy. Hormonal methods Contraceptive implant A contraceptive implant is a thin, plastic tube that contains a hormone that prevents pregnancy. It is different from an intrauterine device (IUD). It is inserted into the upper part of the arm by a health care provider. Implants can be effective for up to 3 years. Progestin-only injections Progestin-only injections are injections of progestin, a synthetic form of the hormone progesterone. They are given every 3 months by a health care provider. Birth control pills Birth control pills are pills that contain hormones that prevent pregnancy. They must be taken once a day, preferably at the same time each day. A prescription is needed to use this method of contraception. Birth control patch The birth control patch contains hormones that prevent pregnancy. It is placed on the skin and must be changed once a week for three weeks and removed on the fourth week. A prescription is needed to use this method of contraception. Vaginal ring A vaginal ring contains hormones that prevent pregnancy. It is placed in the vagina for three weeks and removed on the fourth week. After that, the process is repeated with a new ring. A prescription is needed to use this method of contraception. Emergency contraceptive Emergency contraceptives prevent pregnancy after unprotected sex. They come in pill form and can be taken up to 5 days after sex. They work best the sooner they are taken after having sex. Most emergency contraceptives are available without a prescription. This method should not be used as your only form of birth control.   Barrier methods Female condom A female condom is a thin sheath that is worn over the penis during sex. Condoms keep sperm from going inside a woman's body. They can be used with a sperm-killing substance (spermicide) to increase their  effectiveness. They should be thrown away after one use. Female condom A female condom is a soft, loose-fitting sheath that is put into the vagina before sex. The condom keeps sperm from going inside a woman's body. They should be thrown away after one use. Diaphragm A diaphragm is a soft, dome-shaped barrier. It is inserted into the vagina before sex, along with a spermicide. The diaphragm blocks sperm from entering the uterus, and the spermicide kills sperm. A diaphragm should be left in the vagina for 6-8 hours after sex and removed within 24 hours. A diaphragm is prescribed and fitted by a health care provider. A diaphragm should be replaced every 1-2 years, after giving birth, after gaining more than 15 lb (6.8 kg), and after pelvic surgery. Cervical cap A cervical cap is a round, soft latex or plastic cup that fits over the cervix. It is inserted into the vagina before sex, along with spermicide. It blocks sperm from entering the uterus. The cap should be left in place for 6-8 hours after sex and removed within 48 hours. A cervical cap must be prescribed and fitted by a health care provider. It should be replaced every 2 years. Sponge A sponge is a soft, circular piece of polyurethane foam with spermicide in it. The sponge helps block sperm from entering the uterus, and the spermicide kills sperm. To use it, you make it wet and then insert it into the vagina. It should be inserted before sex, left in for at least 6 hours after sex, and removed and thrown away within 30 hours. Spermicides Spermicides are chemicals that kill or block sperm from entering the   cervix and uterus. They can come as a cream, jelly, suppository, foam, or tablet. A spermicide should be inserted into the vagina with an applicator at least 10-15 minutes before sex to allow time for it to work. The process must be repeated every time you have sex. Spermicides do not require a prescription.   Intrauterine  contraception Intrauterine device (IUD) An IUD is a T-shaped device that is put in a woman's uterus. There are two types:  Hormone IUD.This type contains progestin, a synthetic form of the hormone progesterone. This type can stay in place for 3-5 years.  Copper IUD.This type is wrapped in copper wire. It can stay in place for 10 years. Permanent methods of contraception Female tubal ligation In this method, a woman's fallopian tubes are sealed, tied, or blocked during surgery to prevent eggs from traveling to the uterus. Hysteroscopic sterilization In this method, a small, flexible insert is placed into each fallopian tube. The inserts cause scar tissue to form in the fallopian tubes and block them, so sperm cannot reach an egg. The procedure takes about 3 months to be effective. Another form of birth control must be used during those 3 months. Female sterilization This is a procedure to tie off the tubes that carry sperm (vasectomy). After the procedure, the man can still ejaculate fluid (semen). Another form of birth control must be used for 3 months after the procedure. Natural planning methods Natural family planning In this method, a couple does not have sex on days when the woman could become pregnant. Calendar method In this method, the woman keeps track of the length of each menstrual cycle, identifies the days when pregnancy can happen, and does not have sex on those days. Ovulation method In this method, a couple avoids sex during ovulation. Symptothermal method This method involves not having sex during ovulation. The woman typically checks for ovulation by watching changes in her temperature and in the consistency of cervical mucus. Post-ovulation method In this method, a couple waits to have sex until after ovulation. Where to find more information  Centers for Disease Control and Prevention: www.cdc.gov Summary  Contraception, also called birth control, refers to methods or  devices that prevent pregnancy.  Hormonal methods of contraception include implants, injections, pills, patches, vaginal rings, and emergency contraceptives.  Barrier methods of contraception can include female condoms, female condoms, diaphragms, cervical caps, sponges, and spermicides.  There are two types of IUDs (intrauterine devices). An IUD can be put in a woman's uterus to prevent pregnancy for 3-5 years.  Permanent sterilization can be done through a procedure for males and females. Natural family planning methods involve nothaving sex on days when the woman could become pregnant. This information is not intended to replace advice given to you by your health care provider. Make sure you discuss any questions you have with your health care provider. Document Revised: 12/04/2019 Document Reviewed: 12/04/2019 Elsevier Patient Education  2021 [employer] Inc.   Breastfeeding  Choosing to breastfeed is one of the best decisions you can make for yourself and your baby. A change in hormones during pregnancy causes your breasts to make breast milk in your milk-producing glands. Hormones prevent breast milk from being released before your baby is born. They also prompt milk flow after birth. Once breastfeeding has begun, thoughts of your baby, as well as his or her sucking or crying, can stimulate the release of milk from your milk-producing glands. Benefits of breastfeeding Research shows that breastfeeding offers many health benefits   for infants and mothers. It also offers a cost-free and convenient way to feed your baby. For your baby  Your first milk (colostrum) helps your baby's digestive system to function better.  Special cells in your milk (antibodies) help your baby to fight off infections.  Breastfed babies are less likely to develop asthma, allergies, obesity, or type 2 diabetes. They are also at lower risk for sudden infant death syndrome (SIDS).  Nutrients in breast milk are better  able to meet your baby's needs compared to infant formula.  Breast milk improves your baby's brain development. For you  Breastfeeding helps to create a very special bond between you and your baby.  Breastfeeding is convenient. Breast milk costs nothing and is always available at the correct temperature.  Breastfeeding helps to burn calories. It helps you to lose the weight that you gained during pregnancy.  Breastfeeding makes your uterus return faster to its size before pregnancy. It also slows bleeding (lochia) after you give birth.  Breastfeeding helps to lower your risk of developing type 2 diabetes, osteoporosis, rheumatoid arthritis, cardiovascular disease, and breast, ovarian, uterine, and endometrial cancer later in life. Breastfeeding basics Starting breastfeeding  Find a comfortable place to sit or lie down, with your neck and back well-supported.  Place a pillow or a rolled-up blanket under your baby to bring him or her to the level of your breast (if you are seated). Nursing pillows are specially designed to help support your arms and your baby while you breastfeed.  Make sure that your baby's tummy (abdomen) is facing your abdomen.  Gently massage your breast. With your fingertips, massage from the outer edges of your breast inward toward the nipple. This encourages milk flow. If your milk flows slowly, you may need to continue this action during the feeding.  Support your breast with 4 fingers underneath and your thumb above your nipple (make the letter "C" with your hand). Make sure your fingers are well away from your nipple and your baby's mouth.  Stroke your baby's lips gently with your finger or nipple.  When your baby's mouth is open wide enough, quickly bring your baby to your breast, placing your entire nipple and as much of the areola as possible into your baby's mouth. The areola is the colored area around your nipple. ? More areola should be visible above your  baby's upper lip than below the lower lip. ? Your baby's lips should be opened and extended outward (flanged) to ensure an adequate, comfortable latch. ? Your baby's tongue should be between his or her lower gum and your breast.  Make sure that your baby's mouth is correctly positioned around your nipple (latched). Your baby's lips should create a seal on your breast and be turned out (everted).  It is common for your baby to suck about 2-3 minutes in order to start the flow of breast milk. Latching Teaching your baby how to latch onto your breast properly is very important. An improper latch can cause nipple pain, decreased milk supply, and poor weight gain in your baby. Also, if your baby is not latched onto your nipple properly, he or she may swallow some air during feeding. This can make your baby fussy. Burping your baby when you switch breasts during the feeding can help to get rid of the air. However, teaching your baby to latch on properly is still the best way to prevent fussiness from swallowing air while breastfeeding. Signs that your baby has successfully latched onto   your nipple  Silent tugging or silent sucking, without causing you pain. Infant's lips should be extended outward (flanged).  Swallowing heard between every 3-4 sucks once your milk has started to flow (after your let-down milk reflex occurs).  Muscle movement above and in front of his or her ears while sucking. Signs that your baby has not successfully latched onto your nipple  Sucking sounds or smacking sounds from your baby while breastfeeding.  Nipple pain. If you think your baby has not latched on correctly, slip your finger into the corner of your baby's mouth to break the suction and place it between your baby's gums. Attempt to start breastfeeding again. Signs of successful breastfeeding Signs from your baby  Your baby will gradually decrease the number of sucks or will completely stop sucking.  Your baby  will fall asleep.  Your baby's body will relax.  Your baby will retain a small amount of milk in his or her mouth.  Your baby will let go of your breast by himself or herself. Signs from you  Breasts that have increased in firmness, weight, and size 1-3 hours after feeding.  Breasts that are softer immediately after breastfeeding.  Increased milk volume, as well as a change in milk consistency and color by the fifth day of breastfeeding.  Nipples that are not sore, cracked, or bleeding. Signs that your baby is getting enough milk  Wetting at least 1-2 diapers during the first 24 hours after birth.  Wetting at least 5-6 diapers every 24 hours for the first week after birth. The urine should be clear or pale yellow by the age of 5 days.  Wetting 6-8 diapers every 24 hours as your baby continues to grow and develop.  At least 3 stools in a 24-hour period by the age of 5 days. The stool should be soft and yellow.  At least 3 stools in a 24-hour period by the age of 7 days. The stool should be seedy and yellow.  No loss of weight greater than 10% of birth weight during the first 3 days of life.  Average weight gain of 4-7 oz (113-198 g) per week after the age of 4 days.  Consistent daily weight gain by the age of 5 days, without weight loss after the age of 2 weeks. After a feeding, your baby may spit up a small amount of milk. This is normal. Breastfeeding frequency and duration Frequent feeding will help you make more milk and can prevent sore nipples and extremely full breasts (breast engorgement). Breastfeed when you feel the need to reduce the fullness of your breasts or when your baby shows signs of hunger. This is called "breastfeeding on demand." Signs that your baby is hungry include:  Increased alertness, activity, or restlessness.  Movement of the head from side to side.  Opening of the mouth when the corner of the mouth or cheek is stroked (rooting).  Increased  sucking sounds, smacking lips, cooing, sighing, or squeaking.  Hand-to-mouth movements and sucking on fingers or hands.  Fussing or crying. Avoid introducing a pacifier to your baby in the first 4-6 weeks after your baby is born. After this time, you may choose to use a pacifier. Research has shown that pacifier use during the first year of a baby's life decreases the risk of sudden infant death syndrome (SIDS). Allow your baby to feed on each breast as long as he or she wants. When your baby unlatches or falls asleep while feeding from the   first breast, offer the second breast. Because newborns are often sleepy in the first few weeks of life, you may need to awaken your baby to get him or her to feed. Breastfeeding times will vary from baby to baby. However, the following rules can serve as a guide to help you make sure that your baby is properly fed:  Newborns (babies 4 weeks of age or younger) may breastfeed every 1-3 hours.  Newborns should not go without breastfeeding for longer than 3 hours during the day or 5 hours during the night.  You should breastfeed your baby a minimum of 8 times in a 24-hour period. Breast milk pumping Pumping and storing breast milk allows you to make sure that your baby is exclusively fed your breast milk, even at times when you are unable to breastfeed. This is especially important if you go back to work while you are still breastfeeding, or if you are not able to be present during feedings. Your lactation consultant can help you find a method of pumping that works best for you and give you guidelines about how long it is safe to store breast milk.      Caring for your breasts while you breastfeed Nipples can become dry, cracked, and sore while breastfeeding. The following recommendations can help keep your breasts moisturized and healthy:  Avoid using soap on your nipples.  Wear a supportive bra designed especially for nursing. Avoid wearing underwire-style  bras or extremely tight bras (sports bras).  Air-dry your nipples for 3-4 minutes after each feeding.  Use only cotton bra pads to absorb leaked breast milk. Leaking of breast milk between feedings is normal.  Use lanolin on your nipples after breastfeeding. Lanolin helps to maintain your skin's normal moisture barrier. Pure lanolin is not harmful (not toxic) to your baby. You may also hand express a few drops of breast milk and gently massage that milk into your nipples and allow the milk to air-dry. In the first few weeks after giving birth, some women experience breast engorgement. Engorgement can make your breasts feel heavy, warm, and tender to the touch. Engorgement peaks within 3-5 days after you give birth. The following recommendations can help to ease engorgement:  Completely empty your breasts while breastfeeding or pumping. You may want to start by applying warm, moist heat (in the shower or with warm, water-soaked hand towels) just before feeding or pumping. This increases circulation and helps the milk flow. If your baby does not completely empty your breasts while breastfeeding, pump any extra milk after he or she is finished.  Apply ice packs to your breasts immediately after breastfeeding or pumping, unless this is too uncomfortable for you. To do this: ? Put ice in a plastic bag. ? Place a towel between your skin and the bag. ? Leave the ice on for 20 minutes, 2-3 times a day.  Make sure that your baby is latched on and positioned properly while breastfeeding. If engorgement persists after 48 hours of following these recommendations, contact your health care provider or a lactation consultant. Overall health care recommendations while breastfeeding  Eat 3 healthy meals and 3 snacks every day. Well-nourished mothers who are breastfeeding need an additional 450-500 calories a day. You can meet this requirement by increasing the amount of a balanced diet that you eat.  Drink  enough water to keep your urine pale yellow or clear.  Rest often, relax, and continue to take your prenatal vitamins to prevent fatigue, stress, and low   vitamin and mineral levels in your body (nutrient deficiencies).  Do not use any products that contain nicotine or tobacco, such as cigarettes and e-cigarettes. Your baby may be harmed by chemicals from cigarettes that pass into breast milk and exposure to secondhand smoke. If you need help quitting, ask your health care provider.  Avoid alcohol.  Do not use illegal drugs or marijuana.  Talk with your health care provider before taking any medicines. These include over-the-counter and prescription medicines as well as vitamins and herbal supplements. Some medicines that may be harmful to your baby can pass through breast milk.  It is possible to become pregnant while breastfeeding. If birth control is desired, ask your health care provider about options that will be safe while breastfeeding your baby. Where to find more information: [employer]: www.llli.org Contact a health care provider if:  You feel like you want to stop breastfeeding or have become frustrated with breastfeeding.  Your nipples are cracked or bleeding.  Your breasts are red, tender, or warm.  You have: ? Painful breasts or nipples. ? A swollen area on either breast. ? A fever or chills. ? Nausea or vomiting. ? Drainage other than breast milk from your nipples.  Your breasts do not become full before feedings by the fifth day after you give birth.  You feel sad and depressed.  Your baby is: ? Too sleepy to eat well. ? Having trouble sleeping. ? More than 1 week old and wetting fewer than 6 diapers in a 24-hour period. ? Not gaining weight by 5 days of age.  Your baby has fewer than 3 stools in a 24-hour period.  Your baby's skin or the white parts of his or her eyes become yellow. Get help right away if:  Your baby is overly tired  (lethargic) and does not want to wake up and feed.  Your baby develops an unexplained fever. Summary  Breastfeeding offers many health benefits for infant and mothers.  Try to breastfeed your infant when he or she shows early signs of hunger.  Gently tickle or stroke your baby's lips with your finger or nipple to allow the baby to open his or her mouth. Bring the baby to your breast. Make sure that much of the areola is in your baby's mouth. Offer one side and burp the baby before you offer the other side.  Talk with your health care provider or lactation consultant if you have questions or you face problems as you breastfeed. This information is not intended to replace advice given to you by your health care provider. Make sure you discuss any questions you have with your health care provider. Document Revised: 09/23/2017 Document Reviewed: 07/31/2016 Elsevier Patient Education  2021 Elsevier Inc.  

## 2020-12-26 ENCOUNTER — Other Ambulatory Visit: Payer: Self-pay

## 2020-12-26 ENCOUNTER — Ambulatory Visit (INDEPENDENT_AMBULATORY_CARE_PROVIDER_SITE_OTHER): Payer: Medicaid Other | Admitting: Obstetrics and Gynecology

## 2020-12-26 ENCOUNTER — Other Ambulatory Visit (HOSPITAL_COMMUNITY)
Admission: RE | Admit: 2020-12-26 | Discharge: 2020-12-26 | Disposition: A | Discharge status: Home / Self Care | Payer: Medicaid Other | Source: Ambulatory Visit | Attending: Obstetrics and Gynecology | Admitting: Obstetrics and Gynecology

## 2020-12-26 VITALS — BP 126/87 | HR 84 | Wt 132.0 lb

## 2020-12-26 DIAGNOSIS — O99013 Anemia complicating pregnancy, third trimester: Secondary | ICD-10-CM | POA: Insufficient documentation

## 2020-12-26 DIAGNOSIS — Z3A36 36 weeks gestation of pregnancy: Secondary | ICD-10-CM

## 2020-12-26 DIAGNOSIS — O26893 Other specified pregnancy related conditions, third trimester: Secondary | ICD-10-CM | POA: Insufficient documentation

## 2020-12-26 DIAGNOSIS — R030 Elevated blood-pressure reading, without diagnosis of hypertension: Secondary | ICD-10-CM | POA: Diagnosis not present

## 2020-12-26 DIAGNOSIS — Z3480 Encounter for supervision of other normal pregnancy, unspecified trimester: Secondary | ICD-10-CM

## 2020-12-26 NOTE — Progress Notes (Signed)
Patient complains of occasional contraction and becoming light headed about 2x a week. Patient also stated that when she takes iron pills it causes her to have "cramps" in pelvic

## 2020-12-26 NOTE — Progress Notes (Signed)
   PRENATAL VISIT NOTE  Subjective:  Sydney Rice is a 25 y.o. G1P0000 at [redacted]w[redacted]d being seen today for ongoing prenatal care.  She is currently monitored for the following issues for this low-risk pregnancy and has Supervision of other normal pregnancy, antepartum and Anemia in pregnancy on their problem list.  Patient reports no complaints.  Contractions: Irritability. Vag. Bleeding: None.  Movement: Present. Denies leaking of fluid.   Not taking her iron pills because doesn't like the way they make her feel. Endorses constipation.  Denies headache, vision changes, RUQ pain.   The following portions of the patient's history were reviewed and updated as appropriate: allergies, current medications, past family history, past medical history, past social history, past surgical history and problem list.   Objective:   Vitals:   12/26/20 0852 12/26/20 0900  BP: (!) 132/97 126/87  Pulse: 87 84  Weight: 132 lb (59.9 kg)     Fetal Status: Fetal Heart Rate (bpm): 153 Fundal Height: 36 cm Movement: Present  Presentation: Vertex  General:  Alert, oriented and cooperative. Patient is in no acute distress.  Skin: Skin is warm and dry. No rash noted.   Cardiovascular: Normal heart rate noted  Respiratory: Normal respiratory effort, no problems with respiration noted  Abdomen: Soft, gravid, appropriate for gestational age.  Pain/Pressure: Present   Vertex by leopold's.   Pelvic: Cervical exam deferred        Extremities: Normal range of motion.  Edema: Trace  Mental Status: Normal mood and affect. Normal behavior. Normal judgment and thought content.   Assessment and Plan:  Pregnancy: G1P0000 at [redacted]w[redacted]d 1. Supervision of other normal pregnancy, antepartum   2. Anemia during pregnancy in third trimester -patient not taking iron pills because 'doesn't like they way they make her feel.' Had hypersensitivity reaction to iron infusion, declines further infusions. Discussed importance of  increasing iron stores and encouraged use of laxative, etc. Patient verbalized understanding.   3. [redacted] weeks gestation of pregnancy Swabs obtained  4. Elevated BP without diagnosis of HTN -first BP Elevated, says her blood pressures have been creeping up at home but not greater than 140/90. Discussed continued monitoring at home and call us if elevated. Will obtain PEC labs today, discussed possibility of induction if meets criteria for hypertensive disorder and she verbalizes understanding.   Preterm labor symptoms and general obstetric precautions including but not limited to vaginal bleeding, contractions, leaking of fluid and fetal movement were reviewed in detail with the patient. Please refer to After Visit Summary for other counseling recommendations.   Return in about 1 week (around 01/02/2021) for OB.  Future Appointments  Date Time Provider Department Center  01/06/2021  2:35 PM Madlyn Frankel [facility]    Gita Kudo, MD

## 2020-12-27 LAB — CERVICOVAGINAL ANCILLARY ONLY
Chlamydia: NEGATIVE
Comment: NEGATIVE
Comment: NORMAL
Neisseria Gonorrhea: NEGATIVE

## 2020-12-29 LAB — COMPREHENSIVE METABOLIC PANEL
ALT: 9 IU/L (ref 0–32)
AST: 14 IU/L (ref 0–40)
Albumin/Globulin Ratio: 1.4 (ref 1.2–2.2)
Albumin: 3.3 g/dL — ABNORMAL LOW (ref 3.9–5.0)
Alkaline Phosphatase: 136 IU/L — ABNORMAL HIGH (ref 44–121)
BUN/Creatinine Ratio: 7 — ABNORMAL LOW (ref 9–23)
BUN: 4 mg/dL — ABNORMAL LOW (ref 6–20)
Bilirubin Total: <0.2 mg/dL (ref 0.0–1.2)
CO2: 18 mmol/L — ABNORMAL LOW (ref 20–29)
Calcium: 9.1 mg/dL (ref 8.7–10.2)
Chloride: 107 mmol/L — ABNORMAL HIGH (ref 96–106)
Creatinine, Ser: 0.59 mg/dL (ref 0.57–1.00)
Globulin, Total: 2.4 g/dL (ref 1.5–4.5)
Glucose: 76 mg/dL (ref 65–99)
Potassium: 3.7 mmol/L (ref 3.5–5.2)
Sodium: 141 mmol/L (ref 134–144)
Total Protein: 5.7 g/dL — ABNORMAL LOW (ref 6.0–8.5)
eGFR: 129 mL/min/{1.73_m2} (ref >59)

## 2020-12-29 LAB — CBC
Hematocrit: 31.2 % — ABNORMAL LOW (ref 34.0–46.6)
Hemoglobin: 10.3 g/dL — ABNORMAL LOW (ref 11.1–15.9)
MCH: 31 pg (ref 26.6–33.0)
MCHC: 33 g/dL (ref 31.5–35.7)
MCV: 94 fL (ref 79–97)
Platelets: 257 10*3/uL (ref 150–450)
RBC: 3.32 x10E6/uL — ABNORMAL LOW (ref 3.77–5.28)
RDW: 13.6 % (ref 11.7–15.4)
WBC: 5.8 10*3/uL (ref 3.4–10.8)

## 2020-12-29 LAB — PROTEIN / CREATININE RATIO, URINE
Creatinine, Urine: 120.7 mg/dL
Protein, Ur: 12.5 mg/dL
Protein/Creat Ratio: 104 mg/g creat (ref 0–200)

## 2020-12-29 LAB — CULTURE, BETA STREP (GROUP B ONLY): Strep Gp B Culture: POSITIVE — AB

## 2021-01-01 ENCOUNTER — Telehealth: Payer: Self-pay | Admitting: Family Medicine

## 2021-01-01 NOTE — Telephone Encounter (Signed)
[facility] called with BP in the 130/91 and 146/88 range. No f/u in office until 6/27.  Message left to come to office tomorrow for BP check or come to [facility] with any symptoms. Had one elevated BP in office on 6/16 with normal labs.

## 2021-01-02 ENCOUNTER — Inpatient Hospital Stay (HOSPITAL_COMMUNITY)
Admission: AD | Admit: 2021-01-02 | Discharge: 2021-01-06 | DRG: 807 | Disposition: A | Discharge status: Home / Self Care | Payer: Medicaid Other | Source: Intra-hospital | Attending: Obstetrics & Gynecology | Admitting: Obstetrics & Gynecology

## 2021-01-02 ENCOUNTER — Ambulatory Visit (INDEPENDENT_AMBULATORY_CARE_PROVIDER_SITE_OTHER): Payer: Medicaid Other

## 2021-01-02 ENCOUNTER — Other Ambulatory Visit: Payer: Self-pay

## 2021-01-02 ENCOUNTER — Encounter (HOSPITAL_COMMUNITY): Payer: Self-pay | Admitting: Student

## 2021-01-02 VITALS — BP 134/97 | HR 82 | Wt 132.5 lb

## 2021-01-02 DIAGNOSIS — B951 Streptococcus, group B, as the cause of diseases classified elsewhere: Secondary | ICD-10-CM | POA: Insufficient documentation

## 2021-01-02 DIAGNOSIS — O99824 Streptococcus B carrier state complicating childbirth: Secondary | ICD-10-CM | POA: Diagnosis present

## 2021-01-02 DIAGNOSIS — Z3A37 37 weeks gestation of pregnancy: Secondary | ICD-10-CM | POA: Diagnosis not present

## 2021-01-02 DIAGNOSIS — O134 Gestational [pregnancy-induced] hypertension without significant proteinuria, complicating childbirth: Secondary | ICD-10-CM | POA: Diagnosis present

## 2021-01-02 DIAGNOSIS — Z20822 Contact with and (suspected) exposure to covid-19: Secondary | ICD-10-CM | POA: Diagnosis present

## 2021-01-02 DIAGNOSIS — O139 Gestational [pregnancy-induced] hypertension without significant proteinuria, unspecified trimester: Secondary | ICD-10-CM | POA: Diagnosis present

## 2021-01-02 DIAGNOSIS — O99019 Anemia complicating pregnancy, unspecified trimester: Secondary | ICD-10-CM | POA: Diagnosis present

## 2021-01-02 DIAGNOSIS — Z3480 Encounter for supervision of other normal pregnancy, unspecified trimester: Secondary | ICD-10-CM

## 2021-01-02 DIAGNOSIS — O99820 Streptococcus B carrier state complicating pregnancy: Secondary | ICD-10-CM | POA: Diagnosis not present

## 2021-01-02 DIAGNOSIS — A749 Chlamydial infection, unspecified: Secondary | ICD-10-CM | POA: Diagnosis present

## 2021-01-02 DIAGNOSIS — Z0130 Encounter for examination of blood pressure without abnormal findings: Secondary | ICD-10-CM

## 2021-01-02 DIAGNOSIS — O9902 Anemia complicating childbirth: Secondary | ICD-10-CM | POA: Diagnosis present

## 2021-01-02 DIAGNOSIS — O98819 Other maternal infectious and parasitic diseases complicating pregnancy, unspecified trimester: Secondary | ICD-10-CM | POA: Diagnosis present

## 2021-01-02 HISTORY — DX: Essential (primary) hypertension: I10

## 2021-01-02 LAB — CBC
HCT: 34.3 % — ABNORMAL LOW (ref 36.0–46.0)
Hemoglobin: 11.3 g/dL — ABNORMAL LOW (ref 12.0–15.0)
MCH: 31.2 pg (ref 26.0–34.0)
MCHC: 32.9 g/dL (ref 30.0–36.0)
MCV: 94.8 fL (ref 80.0–100.0)
Platelets: 248 10*3/uL (ref 150–400)
RBC: 3.62 MIL/uL — ABNORMAL LOW (ref 3.87–5.11)
RDW: 14.2 % (ref 11.5–15.5)
WBC: 6.8 10*3/uL (ref 4.0–10.5)
nRBC: 0 % (ref 0.0–0.2)

## 2021-01-02 LAB — PROTEIN / CREATININE RATIO, URINE
Creatinine, Urine: 64.89 mg/dL
Total Protein, Urine: <6 mg/dL

## 2021-01-02 LAB — POCT URINALYSIS DIP (DEVICE)
Bilirubin Urine: NEGATIVE
Glucose, UA: NEGATIVE mg/dL
Hgb urine dipstick: NEGATIVE
Ketones, ur: NEGATIVE mg/dL
Leukocytes,Ua: NEGATIVE
Nitrite: NEGATIVE
Protein, ur: NEGATIVE mg/dL
Specific Gravity, Urine: 1.02 (ref 1.005–1.030)
Urobilinogen, UA: 0.2 mg/dL (ref 0.0–1.0)
pH: 6 (ref 5.0–8.0)

## 2021-01-02 LAB — COMPREHENSIVE METABOLIC PANEL
ALT: 10 U/L (ref 0–44)
AST: 16 U/L (ref 15–41)
Albumin: 2.6 g/dL — ABNORMAL LOW (ref 3.5–5.0)
Alkaline Phosphatase: 112 U/L (ref 38–126)
Anion gap: 6 (ref 5–15)
BUN: <5 mg/dL — ABNORMAL LOW (ref 6–20)
CO2: 24 mmol/L (ref 22–32)
Calcium: 8.5 mg/dL — ABNORMAL LOW (ref 8.9–10.3)
Chloride: 106 mmol/L (ref 98–111)
Creatinine, Ser: 0.57 mg/dL (ref 0.44–1.00)
GFR, Estimated: >60 mL/min (ref >60)
Glucose, Bld: 79 mg/dL (ref 70–99)
Potassium: 3.4 mmol/L — ABNORMAL LOW (ref 3.5–5.1)
Sodium: 136 mmol/L (ref 135–145)
Total Bilirubin: 0.4 mg/dL (ref 0.3–1.2)
Total Protein: 5.4 g/dL — ABNORMAL LOW (ref 6.5–8.1)

## 2021-01-02 LAB — TYPE AND SCREEN
ABO/RH(D): O POS
Antibody Screen: NEGATIVE

## 2021-01-02 LAB — RESP PANEL BY RT-PCR (FLU A&B, COVID) ARPGX2
Influenza A by PCR: NEGATIVE
Influenza B by PCR: NEGATIVE
SARS Coronavirus 2 by RT PCR: NEGATIVE

## 2021-01-02 MED ORDER — MISOPROSTOL 50MCG HALF TABLET
50.0000 ug | ORAL_TABLET | ORAL | Status: DC | PRN
  Filled 2021-01-02: qty 1

## 2021-01-02 MED ORDER — PENICILLIN G POT IN DEXTROSE 60000 UNIT/ML IV SOLN
3.0000 10*6.[IU] | INTRAVENOUS | Status: DC
  Administered 2021-01-02 – 2021-01-04 (×8): 3 10*6.[IU] via INTRAVENOUS
  Filled 2021-01-02 (×8): qty 50

## 2021-01-02 MED ORDER — LACTATED RINGERS IV SOLN
500.0000 mL | INTRAVENOUS | Status: DC | PRN
  Administered 2021-01-03: 500 mL via INTRAVENOUS
  Administered 2021-01-03: 1000 mL via INTRAVENOUS

## 2021-01-02 MED ORDER — OXYTOCIN BOLUS FROM INFUSION
333.0000 mL | Freq: Once | INTRAVENOUS | Status: AC
  Administered 2021-01-04: 333 mL via INTRAVENOUS

## 2021-01-02 MED ORDER — ONDANSETRON HCL 4 MG/2ML IJ SOLN
4.0000 mg | Freq: Four times a day (QID) | INTRAMUSCULAR | Status: DC | PRN
  Administered 2021-01-03: 4 mg via INTRAVENOUS
  Filled 2021-01-02: qty 2

## 2021-01-02 MED ORDER — ACETAMINOPHEN 325 MG PO TABS: 650.0000 mg | ORAL_TABLET | ORAL | Status: DC | PRN

## 2021-01-02 MED ORDER — OXYCODONE-ACETAMINOPHEN 5-325 MG PO TABS: 1.0000 | ORAL_TABLET | ORAL | Status: DC | PRN

## 2021-01-02 MED ORDER — SODIUM CHLORIDE 0.9 % IV SOLN
5.0000 10*6.[IU] | Freq: Once | INTRAVENOUS | Status: AC
  Administered 2021-01-02: 5 10*6.[IU] via INTRAVENOUS
  Filled 2021-01-02: qty 5

## 2021-01-02 MED ORDER — LACTATED RINGERS IV SOLN: INTRAVENOUS | Status: DC

## 2021-01-02 MED ORDER — OXYTOCIN-SODIUM CHLORIDE 30-0.9 UT/500ML-% IV SOLN
1.0000 m[IU]/min | INTRAVENOUS | Status: DC
  Administered 2021-01-02: 2 m[IU]/min via INTRAVENOUS

## 2021-01-02 MED ORDER — SOD CITRATE-CITRIC ACID 500-334 MG/5ML PO SOLN
30.0000 mL | ORAL | Status: DC | PRN
  Filled 2021-01-02: qty 30

## 2021-01-02 MED ORDER — OXYCODONE-ACETAMINOPHEN 5-325 MG PO TABS: 2.0000 | ORAL_TABLET | ORAL | Status: DC | PRN

## 2021-01-02 MED ORDER — OXYTOCIN-SODIUM CHLORIDE 30-0.9 UT/500ML-% IV SOLN
2.5000 [IU]/h | INTRAVENOUS | Status: DC
  Filled 2021-01-02 (×2): qty 500

## 2021-01-02 MED ORDER — LIDOCAINE HCL (PF) 1 % IJ SOLN: 30.0000 mL | INTRAMUSCULAR | Status: DC | PRN

## 2021-01-02 MED ORDER — TERBUTALINE SULFATE 1 MG/ML IJ SOLN: 0.2500 mg | Freq: Once | INTRAMUSCULAR | Status: DC | PRN

## 2021-01-02 NOTE — Progress Notes (Signed)
Here today for BP check due to elevated BP on home cuff yesterday. BP today is 134/97. BP also high in office on 12/26/20. No prior hx of htn. UA today is negative for protein. Reviewed with Luna Kitchens, CNM who states this patient meets criteria for gestational hypertension.  Maryla Morrow, MD consulted together for delivery plan. Pt to be induced today at 37w 5d due to new diagnosis of gestational hypertension. Called report to Hillis Range, RN at [facility] who requests pt come to [facility] at 1 PM. Pt notified of plan and all concerns addressed regarding IOL process.   Reviewed GBS pos result with patient and added GBS pos to pink sticky note and problem list.   Farley Ly 01/02/21

## 2021-01-02 NOTE — Progress Notes (Signed)
Reported to bedside to perform cervical check as patient's contractions have spaced out from q1 min to q1-3 min. Cervical exam 5/50/-2, will start pitocin at this time. AROM when able.  Alric Seton, MD OB Fellow, Faculty [facility], Center for [facility] 01/02/2021 10:34 PM

## 2021-01-02 NOTE — H&P (Addendum)
OBSTETRIC ADMISSION HISTORY AND PHYSICAL  Sydney Rice is a 25 y.o. female G1P0000 with IUP at [redacted]w[redacted]d by L/8 presenting for IOL secondary to gHTN. She reports +FMs, No LOF, no VB, no blurry vision, headaches or peripheral edema, and RUQ pain.  She plans on breast feeding. She is undecided for birth control, considering copper IUD. Does not like hormonal birth control. She received her prenatal care at [facility]  Dating: By L/8 --->  Estimated Date of Delivery: 01/18/21  Sono:    '@[redacted]w[redacted]d'$ , CWD, normal anatomy, cephalic presentation, posterior lie, 289g, 51% EFW   Prenatal History/Complications:  gHTN - new diagnosis based one elevated BP on two separate occasions in past two weeks  Anemia - has declined further IV iron infusions, intermittently taking oral iron  GBS positive  Past Medical History: Past Medical History:  Diagnosis Date   Hypertension    Medical history non-contributory     Past Surgical History: Past Surgical History:  Procedure Laterality Date   NO PAST SURGERIES      Obstetrical History: OB History     Gravida  1   Para  0   Term  0   Preterm  0   AB  0   Living  0      SAB  0   IAB  0   Ectopic  0   Multiple  0   Live Births  0           Social History Social History   Socioeconomic History   Marital status: Single    Spouse name: Not on file   Number of children: Not on file   Years of education: Not on file   Highest education level: Not on file  Occupational History   Occupation: Ship broker  Tobacco Use   Smoking status: Never   Smokeless tobacco: Never  Vaping Use   Vaping Use: Never used  Substance and Sexual Activity   Alcohol use: Not Currently   Drug use: Not Currently    Types: Marijuana   Sexual activity: Yes    Birth control/protection: None, Condom  Other Topics Concern   Not on file  Social History Narrative   Not on file   Social Determinants of Health   Financial Resource Strain: Not on file   Food Insecurity: No Food Insecurity   Worried About Running Out of Food in the Last Year: Never true   Ran Out of Food in the Last Year: Never true  Transportation Needs: No Transportation Needs   Lack of Transportation (Medical): No   Lack of Transportation (Non-Medical): No  Physical Activity: Not on file  Stress: Not on file  Social Connections: Not on file    Family History: Family History  Problem Relation Age of Onset   Hypertension Mother     Allergies: Allergies  Allergen Reactions   Venofer [Iron Sucrose] Hives, Itching, Nausea And Vomiting, Swelling and Other (See Comments)    Chills, swelling of both hands and feet, low back pain, chest tightness on inhalation    Medications Prior to Admission  Medication Sig Dispense Refill Last Dose   aspirin EC 81 MG tablet Take 1 tablet (81 mg total) by mouth daily. Take after 12 weeks for prevention of preeclampsia later in pregnancy 300 tablet 2 01/01/2021   Blood Pressure Monitoring (BLOOD PRESSURE KIT) DEVI 1 Device by Does not apply route as needed. 1 each 0 01/01/2021   ferrous sulfate 325 (65 FE) MG tablet Take  1 tablet (325 mg total) by mouth every other day. 30 tablet 3 Past Week   Prenatal Vit-Fe Fumarate-FA (PRENATAL MULTIVITAMIN) TABS tablet Take 1 tablet by mouth daily at 12 noon.   01/01/2021   acetaminophen (TYLENOL) 500 MG tablet Take 500 mg by mouth every 6 (six) hours as needed.   Unknown     Review of Systems   All systems reviewed and negative except as stated in HPI  Blood pressure 132/82, pulse 84, temperature 98.1 F (36.7 C), temperature source Axillary, resp. rate 16, height $RemoveBe'5\' 1"'trWfobupm$  (1.549 m), weight 61.5 kg, last menstrual period 04/13/2020. General appearance: alert, cooperative, appears stated age, and no distress Lungs: non-labored respirations Heart: regular rate Abdomen: gravid Presentation: cephalic Fetal monitoringBaseline: 150 bpm, Variability: Good {> 6 bpm), Accelerations: Reactive, and  Decelerations: Absent Uterine activityFrequency: Every 1 minutes Dilation: 1 Effacement (%): 50 Station: -3 Exam by:: Keith Rake RN   Prenatal labs: ABO, Rh: --/--/O POS (06/23 1417) Antibody: NEG (06/23 1417) Rubella: 4.03 (01/04 1513) RPR: Non Reactive (04/18 0832)  HBsAg: Negative (01/04 1513)  HIV: Non Reactive (04/18 0832)  GBS: Positive/-- (06/16 0949)  2 hr Glucola wnl Genetic screening  NIPS low risk Anatomy US nl  Prenatal Transfer Tool  Maternal Diabetes: No Genetic Screening: Normal Maternal Ultrasounds/Referrals: Normal Fetal Ultrasounds or other Referrals:  None Maternal Substance Abuse:  No Significant Maternal Medications:  None Significant Maternal Lab Results: Group B Strep positive  Results for orders placed or performed during the hospital encounter of 01/02/21 (from the past 24 hour(s))  Resp Panel by RT-PCR (Flu A&B, Covid) Nasopharyngeal Swab   Collection Time: 01/02/21  2:17 PM   Specimen: Nasopharyngeal Swab; Nasopharyngeal(NP) swabs in vial transport medium  Result Value Ref Range   SARS Coronavirus 2 by RT PCR NEGATIVE NEGATIVE   Influenza A by PCR NEGATIVE NEGATIVE   Influenza B by PCR NEGATIVE NEGATIVE  Type and screen [place]   Collection Time: 01/02/21  2:17 PM  Result Value Ref Range   ABO/RH(D) O POS    Antibody Screen NEG    Sample Expiration      01/05/2021,2359 Performed at [facility], [address], [place], [place] [address]   CBC   Collection Time: 01/02/21  2:38 PM  Result Value Ref Range   WBC 6.8 4.0 - 10.5 K/uL   RBC 3.62 (L) 3.87 - 5.11 MIL/uL   Hemoglobin 11.3 (L) 12.0 - 15.0 g/dL   HCT 34.3 (L) 36.0 - 46.0 %   MCV 94.8 80.0 - 100.0 fL   MCH 31.2 26.0 - 34.0 pg   MCHC 32.9 30.0 - 36.0 g/dL   RDW 14.2 11.5 - 15.5 %   Platelets 248 150 - 400 K/uL   nRBC 0.0 0.0 - 0.2 %  Results for orders placed or performed in visit on 01/02/21 (from the past 24 hour(s))  POCT urinalysis dip  (device)   Collection Time: 01/02/21 10:26 AM  Result Value Ref Range   Glucose, UA NEGATIVE NEGATIVE mg/dL   Bilirubin Urine NEGATIVE NEGATIVE   Ketones, ur NEGATIVE NEGATIVE mg/dL   Specific Gravity, Urine 1.020 1.005 - 1.030   Hgb urine dipstick NEGATIVE NEGATIVE   pH 6.0 5.0 - 8.0   Protein, ur NEGATIVE NEGATIVE mg/dL   Urobilinogen, UA 0.2 0.0 - 1.0 mg/dL   Nitrite NEGATIVE NEGATIVE   Leukocytes,Ua NEGATIVE NEGATIVE    Patient Active Problem List   Diagnosis Date Noted   Group  beta Strep positive 01/02/2021   Gestational hypertension 01/02/2021   Anemia in pregnancy 10/29/2020   Supervision of other normal pregnancy, antepartum 07/16/2020    Assessment/Plan:  Envy Meno is a 25 y.o. G1P0000 at [redacted]w[redacted]d here for IOL secondary to gHTN  #IOL:   Latent. FB placed (1615), currently contracting too frequently for cytotec. Will monitor and consider dosing if space out. Anticipate NSVD. #gHTN: newly diagnosed, normotensive currently and asymptomatic. PEC labs pending. #Pain: Per patient request, will try to go without epidural #FWB: Cat I #ID:  GBS positive, will start PCN ppx #MOF: breast #MOC: undecided, considering copper IUD inpatient vs outpatient #Circ:  Desired #anemia: Hgb 11.3 on admission  Zola Button, MD  01/02/2021, 4:21 PM    I saw and evaluated the patient. I agree with the findings and the plan of care as documented in the resident's note.  Sharene Skeans, MD [facility] Family Medicine Fellow, [facility] for [facility], [place]

## 2021-01-02 NOTE — Progress Notes (Signed)
Labor Progress Note Roslin Norwood is a 25 y.o. G1P0000 at [redacted]w[redacted]d presented for IOL-gHTN.  S: Doing well without complaints, feeling contractions.  O:  BP 140/83   Pulse 84   Temp 98.2 F (36.8 C) (Oral)   Resp 16   Ht 5\' 1"  (1.549 m)   Wt 61.5 kg   LMP 04/13/2020   BMI 25.62 kg/m  EFM: baseline 150bpm/mod variability/+accels/no decels Toco: q1 min  CVE: Dilation: 1 Effacement (%): 50 Station: -3 Presentation: Vertex Exam by:: Dr. [email or web address]   A&P: 25 y.o. G1P0000 [redacted]w[redacted]d presented for IOL-gHTN. #IOL: S/p FB, dislodged with this check. Will defer cervical exam given patient cannot receive pit or cytotec given contraction frequency. #Pain: PRN #FWB: cat 1 #GBS positive, PCN #gHTN: mild range BP, asymptomatic, preE labs nml.  [redacted]w[redacted]d, MD 9:31 PM

## 2021-01-03 ENCOUNTER — Inpatient Hospital Stay (HOSPITAL_COMMUNITY): Payer: Medicaid Other | Admitting: Anesthesiology

## 2021-01-03 LAB — CBC
HCT: 30.6 % — ABNORMAL LOW (ref 36.0–46.0)
Hemoglobin: 10.2 g/dL — ABNORMAL LOW (ref 12.0–15.0)
MCH: 30.8 pg (ref 26.0–34.0)
MCHC: 33.3 g/dL (ref 30.0–36.0)
MCV: 92.4 fL (ref 80.0–100.0)
Platelets: 214 10*3/uL (ref 150–400)
RBC: 3.31 MIL/uL — ABNORMAL LOW (ref 3.87–5.11)
RDW: 14 % (ref 11.5–15.5)
WBC: 9.5 10*3/uL (ref 4.0–10.5)
nRBC: 0 % (ref 0.0–0.2)

## 2021-01-03 LAB — RPR: RPR Ser Ql: NONREACTIVE

## 2021-01-03 MED ORDER — PHENYLEPHRINE 40 MCG/ML (10ML) SYRINGE FOR IV PUSH (FOR BLOOD PRESSURE SUPPORT): 80.0000 ug | PREFILLED_SYRINGE | INTRAVENOUS | Status: DC | PRN

## 2021-01-03 MED ORDER — LACTATED RINGERS AMNIOINFUSION: INTRAVENOUS | Status: DC

## 2021-01-03 MED ORDER — LIDOCAINE HCL (PF) 1 % IJ SOLN
INTRAMUSCULAR | Status: DC | PRN
  Administered 2021-01-03 (×2): 5 mL via EPIDURAL

## 2021-01-03 MED ORDER — FENTANYL CITRATE (PF) 100 MCG/2ML IJ SOLN
100.0000 ug | INTRAMUSCULAR | Status: DC | PRN
Start: 2021-01-03 — End: 2021-01-04
  Administered 2021-01-03 (×2): 100 ug via INTRAVENOUS
  Filled 2021-01-03 (×2): qty 2

## 2021-01-03 MED ORDER — EPHEDRINE 5 MG/ML INJ: 10.0000 mg | INTRAVENOUS | Status: DC | PRN

## 2021-01-03 MED ORDER — LACTATED RINGERS IV SOLN: 500.0000 mL | Freq: Once | INTRAVENOUS | Status: DC

## 2021-01-03 MED ORDER — FENTANYL-BUPIVACAINE-NACL 0.5-0.125-0.9 MG/250ML-% EP SOLN
12.0000 mL/h | EPIDURAL | Status: DC | PRN
  Administered 2021-01-03 (×2): 12 mL/h via EPIDURAL
  Filled 2021-01-03 (×2): qty 250

## 2021-01-03 MED ORDER — DIPHENHYDRAMINE HCL 50 MG/ML IJ SOLN
12.5000 mg | INTRAMUSCULAR | Status: DC | PRN
  Administered 2021-01-03 – 2021-01-04 (×2): 12.5 mg via INTRAVENOUS
  Filled 2021-01-03 (×2): qty 1

## 2021-01-03 MED ORDER — HYDROXYZINE HCL 50 MG PO TABS
25.0000 mg | ORAL_TABLET | Freq: Three times a day (TID) | ORAL | Status: AC | PRN
  Administered 2021-01-03: 25 mg via ORAL
  Filled 2021-01-03: qty 1

## 2021-01-03 NOTE — Progress Notes (Addendum)
Labor Progress Note Sydney Rice is a 25 y.o. G1P0000 at [redacted]w[redacted]d presented for IOL-gHTN.  S: Strip reviewed.  O:  BP (!) 140/98   Pulse 86   Temp 98.2 F (36.8 C) (Oral)   Resp 19   Ht 5\' 1"  (1.549 m)   Wt 61.5 kg   LMP 04/13/2020   BMI 25.62 kg/m  EFM: baseline 150bpm/mod variability/+accels/intermittent variable decels with contractions Toco: q2-3 min  CVE: Dilation: 5.5 Effacement (%): 50 Cervical Position: Middle Station: -2 Presentation: Vertex Exam by:: Dr. [email or web address]   A&P: 25 y.o. G1P0000 [redacted]w[redacted]d presented for IOL-gHTN. #IOL: S/p FB. Pitocin started @2245 , currently at 48mL/hr. AROM when able. #Pain: PRN #FWB: cat 2, overall reassuring #GBS positive, PCN #gHTN: mild range BP, asymptomatic, preE labs nml.  , MD 6:11 AM

## 2021-01-03 NOTE — Progress Notes (Signed)
Labor Progress Note Sydney Rice is a 25 y.o. G1P0000 at [redacted]w[redacted]d presented for IOL-gHTN.  S: Comfortable with epidural.  O:  BP (!) 114/99   Pulse 92   Temp 99.7 F (37.6 C) (Axillary)   Resp 16   Ht 5\' 1"  (1.549 m)   Wt 61.5 kg   LMP 04/13/2020   SpO2 99%   BMI 25.62 kg/m  EFM: baseline 150/moderate variability/+accels/occasional variable decel IUPC: every 1.5-4 min, 130 MVU  CVE: Dilation: 9 Effacement (%): 90 Cervical Position: Middle Station: 0 Presentation: Vertex Exam by:: [email or web address] MD Several dime-sized blood clots noted on towel prior to cervical exam.  A&P: 25 y.o. G1P0000 [redacted]w[redacted]d IOL gHTN #IOL: S/p FB. Pitocin started at 2245 on 6/23. AROM with clear fluid at 0615, currently at 10 mu/min. Patient has made good cervical since last exam. #Pain: epidural #FWB: category I #GBS positive, PCN #gHTN: intermittent mild range BP, asymptomatic, PEC labs nl  7/23, MD 9:03 PM

## 2021-01-03 NOTE — Progress Notes (Addendum)
Labor Progress Note Sydney Rice is a 25 y.o. G1P0000 at [redacted]w[redacted]d presented for IOL-gHTN  S: Painful contractions. Small amount of dark vaginal bleeding noted on towel.  O:  BP 123/65   Pulse 87   Temp 98.8 F (37.1 C) (Oral)   Resp 18   Ht 5\' 1"  (1.549 m)   Wt 61.5 kg   LMP 04/13/2020   BMI 25.62 kg/m  EFM: baseline 150/moderate variability/+accels/occasional variable decel IUPC: every 1-2 min, 190 MVU  CVE: Dilation: 5.5 Effacement (%): 50 Cervical Position: Middle Station: -1 Presentation: Vertex Exam by:: [email or web address] RN (Pt educated on risk of infection with increased SVE after AROM; pt continued to request for SVE related to "having to poop")   A&P: 25 y.o. G1P0000 [redacted]w[redacted]d IOL gHTN #IOL: s/p FB. Pitocin started at 2245 (6/23), currently at 16 mu/min. Will hold at this dose given frequency of contraction pattern. Contractions borderline adequate per IUPC. #Pain: per patient request, not planning epidural #FWB: category II given occasional variable but overall category I. Amnioinfusion discontinued given minimal output. #GBS positive, PCN #gHTN: intermittent mild range BP, asymptomatic, PEC labs nl  09-01-1997, MD 10:41 AM

## 2021-01-03 NOTE — Progress Notes (Addendum)
Labor Progress Note Sydney Rice is a 25 y.o. G1P0000 at [redacted]w[redacted]d presented for IOL-gHTN  S: Pt now more comfortable with epidural in place. Discussed that if bleeding worsens or concern of fetal distress, we may need to change the plan for mode of delivery. Pt reports understanding. No concerns at this time. Pt's mother and partner at bedside.  O:  BP 132/82   Pulse 87   Temp 98.2 F (36.8 C) (Axillary)   Resp 16   Ht 5\' 1"  (1.549 m)   Wt 61.5 kg   LMP 04/13/2020   SpO2 99%   BMI 25.62 kg/m  EFM: baseline 150/moderate variability/+accels/occasional variable decel IUPC: every 1-2 min, 190 MVU  CVE: Dilation: 5.5 Effacement (%): 50 Cervical Position: Middle Station: -1 Presentation: Vertex Exam by:: Dr. [email or web address]   A&P: 24 y.o. G1P0000 [redacted]w[redacted]d IOL gHTN #IOL: S/p FB. Pitocin started at 2245 on 6/23. AROM for clear fluid at 0615. Unchanged cervical exam since 2230. Fetal position asynclitic on bedside ultrasound. Given recurrent variable decels, amnioinfusion was started at 0630 on 6/24 but ultimately discontinued given poor fluid return. Most recently, pitocin was decreased from 16 to 8 milli-units/min given concern of tachysystole in the setting of new vaginal bleeding. Discussed with patient potential of partial abruption and potential need for Cesarean if worsening bleeding or concern of fetal distress as noted above. Reassuringly, good fetal tolerance of pitocin at this time in the setting of adequate contractions. Dr. 7/24 updated regarding pt's status. #Pain: epidural in place #FWB: Category 1 strip #GBS positive, PCN #gHTN: intermittent mild range BP, asymptomatic, PEC labs nl  Debroah Loop, MD 1:10 PM

## 2021-01-03 NOTE — Progress Notes (Addendum)
Labor Progress Note Sydney Rice is a 25 y.o. G1P0000 at [redacted]w[redacted]d presented for IOL-gHTN.  S: Comfortable with epidural, was able to take a nap.  O:  BP 117/70   Pulse 85   Temp 98 F (36.7 C) (Oral)   Resp 16   Ht 5\' 1"  (1.549 m)   Wt 61.5 kg   LMP 04/13/2020   SpO2 99%   BMI 25.62 kg/m  EFM: baseline 130/moderate variability/+accels/occasional variable decel IUPC: every 1.5-4 min, 130 MVU  CVE: Dilation: 5.5 Effacement (%): 90 Cervical Position: Middle Station: 0 Presentation: Vertex Exam by:: Dr. [email or web address] Several dime-sized blood clots noted on towel prior to cervical exam.  A&P: 25 y.o. G1P0000 [redacted]w[redacted]d IOL gHTN #IOL: S/p FB. Pitocin started at 2245 on 6/23. AROM with clear fluid at 0615, currently at 8 mu/min. Minimal cervical change since 2230 on 6/23 but reassuringly, some advancement in fetal station since placement of epidural. Fetal position asynclitic on bedside ultrasound. Bleeding is minimal, will continue to monitor. Given good fetal tolerance with pitocin will continue to monitor and up-titrate as clinically indicated. #Pain: epidural in place #FWB: category II given occasional variable decel; overall category I #GBS positive, PCN #gHTN: intermittent mild range BP, asymptomatic, PEC labs nl  7/23, MD 4:29 PM

## 2021-01-03 NOTE — Progress Notes (Signed)
Labor Progress Note Sydney Rice is a 25 y.o. G1P0000 at [redacted]w[redacted]d presented for IOL-gHTN.  S: Doing well, painful contractions.  O:  BP (!) 140/98   Pulse 86   Temp 98.8 F (37.1 C) (Oral)   Resp 19   Ht 5\' 1"  (1.549 m)   Wt 61.5 kg   LMP 04/13/2020   BMI 25.62 kg/m  EFM: baseline 130bpm/mod variability/+accels/intermittent variable decels with contractions Toco: q2-3 min  CVE: Dilation: 5.5 Effacement (%): 50 Cervical Position: Middle Station: -2 Presentation: Vertex Exam by:: Dr. [email or web address]   A&P: 25 y.o. G1P0000 [redacted]w[redacted]d presented for IOL-gHTN. #IOL: S/p FB. Pitocin started @2245 , currently at 61mL/hr. AROM with clear fluid. Continue to titrate. Will start amnioinfusion if variable decels continue. #Pain: PRN #FWB: cat 2, overall reassuring, will start amnio if variables continue #GBS positive, PCN #gHTN: mild range BP, asymptomatic, preE labs nml.  , MD 6:29 AM

## 2021-01-03 NOTE — Anesthesia Procedure Notes (Signed)
Epidural Patient location during procedure: OB Start time: 01/03/2021 12:18 PM End time: 01/03/2021 12:28 PM  Staffing Anesthesiologist: Leonides Grills, MD Performed: anesthesiologist   Preanesthetic Checklist Completed: patient identified, IV checked, site marked, risks and benefits discussed, monitors and equipment checked, pre-op evaluation and timeout performed  Epidural Patient position: sitting Prep: DuraPrep Patient monitoring: heart rate, cardiac monitor, continuous pulse ox and blood pressure Approach: midline Location: L4-L5 Injection technique: LOR air  Needle:  Needle type: Tuohy  Needle gauge: 17 G Needle length: 9 cm Needle insertion depth: 7 cm Catheter type: closed end flexible Catheter size: 19 Gauge Catheter at skin depth: 12 cm Test dose: negative and Other  Assessment Events: blood not aspirated, injection not painful, no injection resistance and negative IV test  Additional Notes Informed consent obtained prior to proceeding including risk of failure, 1% risk of PDPH, risk of minor discomfort and bruising. Discussed alternatives to epidural analgesia and patient desires to proceed.  Timeout performed pre-procedure verifying patient name, procedure, and platelet count.  Patient tolerated procedure well. Reason for block:procedure for pain

## 2021-01-03 NOTE — Anesthesia Preprocedure Evaluation (Signed)
Anesthesia Evaluation  Patient identified by MRN, date of birth, ID band Patient awake    Reviewed: Allergy & Precautions, H&P , NPO status , Patient's Chart, lab work & pertinent test results  History of Anesthesia Complications Negative for: history of anesthetic complications  Airway Mallampati: II  TM Distance: >3 FB Neck ROM: full    Dental no notable dental hx. (+) Teeth Intact   Pulmonary neg pulmonary ROS,    Pulmonary exam normal breath sounds clear to auscultation       Cardiovascular hypertension, Normal cardiovascular exam Rhythm:regular Rate:Normal     Neuro/Psych negative neurological ROS  negative psych ROS   GI/Hepatic negative GI ROS, Neg liver ROS,   Endo/Other  negative endocrine ROS  Renal/GU negative Renal ROS  negative genitourinary   Musculoskeletal   Abdominal   Peds  Hematology  (+) Blood dyscrasia, anemia ,   Anesthesia Other Findings   Reproductive/Obstetrics (+) Pregnancy                             Anesthesia Physical Anesthesia Plan  ASA: 2  Anesthesia Plan: Epidural   Post-op Pain Management:    Induction:   PONV Risk Score and Plan:   Airway Management Planned:   Additional Equipment:   Intra-op Plan:   Post-operative Plan:   Informed Consent: I have reviewed the patients History and Physical, chart, labs and discussed the procedure including the risks, benefits and alternatives for the proposed anesthesia with the patient or authorized representative who has indicated his/her understanding and acceptance.       Plan Discussed with:   Anesthesia Plan Comments:         Anesthesia Quick Evaluation

## 2021-01-04 ENCOUNTER — Encounter (HOSPITAL_COMMUNITY): Payer: Self-pay | Admitting: Student

## 2021-01-04 DIAGNOSIS — O99820 Streptococcus B carrier state complicating pregnancy: Secondary | ICD-10-CM

## 2021-01-04 DIAGNOSIS — O134 Gestational [pregnancy-induced] hypertension without significant proteinuria, complicating childbirth: Secondary | ICD-10-CM

## 2021-01-04 DIAGNOSIS — Z3A37 37 weeks gestation of pregnancy: Secondary | ICD-10-CM

## 2021-01-04 MED ORDER — TRANEXAMIC ACID-NACL 1000-0.7 MG/100ML-% IV SOLN: 1000.0000 mg | Freq: Once | INTRAVENOUS | Status: AC

## 2021-01-04 MED ORDER — ACETAMINOPHEN 500 MG PO TABS
1000.0000 mg | ORAL_TABLET | Freq: Once | ORAL | Status: AC
  Administered 2021-01-04: 1000 mg via ORAL
  Filled 2021-01-04: qty 2

## 2021-01-04 MED ORDER — SENNOSIDES-DOCUSATE SODIUM 8.6-50 MG PO TABS
2.0000 | ORAL_TABLET | ORAL | Status: DC
  Administered 2021-01-04 – 2021-01-05 (×2): 2 via ORAL
  Filled 2021-01-04 (×2): qty 2

## 2021-01-04 MED ORDER — DIPHENHYDRAMINE HCL 25 MG PO CAPS
25.0000 mg | ORAL_CAPSULE | Freq: Four times a day (QID) | ORAL | Status: DC | PRN
Start: 2021-01-04 — End: 2021-01-06

## 2021-01-04 MED ORDER — TRANEXAMIC ACID-NACL 1000-0.7 MG/100ML-% IV SOLN
INTRAVENOUS | Status: AC
  Administered 2021-01-04: 1000 mg via INTRAVENOUS
  Filled 2021-01-04: qty 100

## 2021-01-04 MED ORDER — DIBUCAINE (PERIANAL) 1 % EX OINT: 1.0000 "application " | TOPICAL_OINTMENT | CUTANEOUS | Status: DC | PRN

## 2021-01-04 MED ORDER — BENZOCAINE-MENTHOL 20-0.5 % EX AERO
1.0000 "application " | INHALATION_SPRAY | CUTANEOUS | Status: DC | PRN
  Administered 2021-01-04: 1 via TOPICAL
  Filled 2021-01-04: qty 56

## 2021-01-04 MED ORDER — TETANUS-DIPHTH-ACELL PERTUSSIS 5-2.5-18.5 LF-MCG/0.5 IM SUSY: 0.5000 mL | PREFILLED_SYRINGE | Freq: Once | INTRAMUSCULAR | Status: DC

## 2021-01-04 MED ORDER — ONDANSETRON HCL 4 MG/2ML IJ SOLN: 4.0000 mg | INTRAMUSCULAR | Status: DC | PRN

## 2021-01-04 MED ORDER — ONDANSETRON HCL 4 MG PO TABS: 4.0000 mg | ORAL_TABLET | ORAL | Status: DC | PRN

## 2021-01-04 MED ORDER — WITCH HAZEL-GLYCERIN EX PADS: 1.0000 "application " | MEDICATED_PAD | CUTANEOUS | Status: DC | PRN

## 2021-01-04 MED ORDER — IBUPROFEN 600 MG PO TABS
600.0000 mg | ORAL_TABLET | Freq: Four times a day (QID) | ORAL | Status: DC
  Administered 2021-01-04 – 2021-01-06 (×8): 600 mg via ORAL
  Filled 2021-01-04 (×8): qty 1

## 2021-01-04 MED ORDER — MEASLES, MUMPS & RUBELLA VAC IJ SOLR: 0.5000 mL | Freq: Once | INTRAMUSCULAR | Status: DC

## 2021-01-04 MED ORDER — ACETAMINOPHEN 325 MG PO TABS: 650.0000 mg | ORAL_TABLET | ORAL | Status: DC | PRN

## 2021-01-04 MED ORDER — SIMETHICONE 80 MG PO CHEW: 80.0000 mg | CHEWABLE_TABLET | ORAL | Status: DC | PRN

## 2021-01-04 MED ORDER — COCONUT OIL OIL
1.0000 "application " | TOPICAL_OIL | Status: DC | PRN
  Administered 2021-01-05: 1 via TOPICAL

## 2021-01-04 MED ORDER — PRENATAL MULTIVITAMIN CH
1.0000 | ORAL_TABLET | Freq: Every day | ORAL | Status: DC
  Administered 2021-01-04 – 2021-01-05 (×2): 1 via ORAL
  Filled 2021-01-04 (×2): qty 1

## 2021-01-04 MED ORDER — DIPHENHYDRAMINE HCL 50 MG/ML IJ SOLN
25.0000 mg | Freq: Once | INTRAMUSCULAR | Status: AC
  Administered 2021-01-04: 25 mg via INTRAVENOUS
  Filled 2021-01-04: qty 1

## 2021-01-04 NOTE — Lactation Note (Signed)
This note was copied from a baby's chart. Lactation Consultation Note  Patient Name: Sydney Rice [id number] Date: 01/04/2021 Reason for consult: Initial assessment Age:24 hours P1, ETI female infant with one stool. Per mom, infant latched well in L&D, but not latched since 0840 am , mom has made 3 attempts. Mom has been attempting to latch infant swaddled in blankets, LC encouraged mom latch infant STS and offer breastfeeding techniques to keep infant awake while breastfeeding. Afterwards infant was cuing to breastfeed, mom latched infant on her right breast using the football hold position, infant latched with depth and was still breastfeeding after 16 minutes when LC left the room.  LC reviewed hand expression and mom taught back, infant was given 3 mls of colostrum by spoon. LC discussed infant's input and output with mom.  Mom made aware of O/P services, breastfeeding support groups, community resources, and our phone # for post-discharge questions.   Mom's plan: 1- BF infant according to feeding cues, 8 to 12+ or more times within 24 hours, STS. 2- Mom will do breast stimulation techniques to keep infant awake while BF such as: talking to infant, gently stroking infant's neck and shoulder, BF infant STS. 3- Mom knows to call RN or LC for latch assistance if need. 4- Mom will do hand expression, giving infant back her EBM if infant doesn't latch at the breast.  Maternal Data Has patient been taught Hand Expression?: Yes Does the patient have breastfeeding experience prior to this delivery?: No  Feeding Mother's Current Feeding Choice: Breast Milk and Formula  LATCH Score Latch: Grasps breast easily, tongue down, lips flanged, rhythmical sucking.  Audible Swallowing: Spontaneous and intermittent  Type of Nipple: Everted at rest and after stimulation  Comfort (Breast/Nipple): Soft / non-tender  Hold (Positioning): Assistance needed to correctly position infant at breast and  maintain latch.  LATCH Score: 9   Lactation Tools Discussed/Used    Interventions Interventions: Breast feeding basics reviewed;Assisted with latch;Skin to skin;Hand express;Breast compression;Adjust position;Support pillows;Position options;Expressed milk;Education  Discharge Pump: Manual WIC Program: No  Consult Status Consult Status: Follow-up Date: 01/05/21 Follow-up type: In-patient    Danelle Earthly 01/04/2021, 4:30 PM

## 2021-01-04 NOTE — Lactation Note (Signed)
Lactation Consultation Note  Patient Name: Sydney Rice [id number] Date: 01/04/2021 Reason for consult: L&D Initial assessment;Primapara;1st time breastfeeding;Early term 35-38.6wks Age:25 y.o.   Initial Lactation Consult:  Visited with family < 1 hour after birth Baby awake when I arrived.  Attempted to latch, however, he was not interested in opening his mouth; gazed at mother.  Reassured mother that assistance will be provided on the M/B unit.  Placed him STS on mother's chest and he was content; continued gazing at mother; no feeding cues demonstrated.  Allowed time for family bonding.   Maternal Data    Feeding Mother's Current Feeding Choice: Breast Milk  LATCH Score Latch: Too sleepy or reluctant, no latch achieved, no sucking elicited.  Audible Swallowing: None  Type of Nipple: Everted at rest and after stimulation  Comfort (Breast/Nipple): Soft / non-tender  Hold (Positioning): Assistance needed to correctly position infant at breast and maintain latch.  LATCH Score: 5   Lactation Tools Discussed/Used    Interventions Interventions: Assisted with latch;Skin to skin  Discharge    Consult Status Consult Status: Follow-up Date: 01/04/21 Follow-up type: In-patient    Terrall Bley R Tyrone Balash 01/04/2021, 4:59 AM

## 2021-01-04 NOTE — Discharge Summary (Signed)
Postpartum Discharge Summary    Patient Name: Sydney Rice DOB: Aug 27, 1995 MRN: [id number]  Date of admission: 01/02/2021 Delivery date:01/04/2021  Delivering provider: Arrie Senate  Date of discharge: 01/06/2021  Admitting diagnosis: Gestational hypertension [O13.9] Intrauterine pregnancy: [redacted]w[redacted]d     Secondary diagnosis:  Active Problems:   Supervision of other normal pregnancy, antepartum   Anemia in pregnancy   Group beta Strep positive   Gestational hypertension   Vaginal delivery  Additional problems: none    Discharge diagnosis: Term Pregnancy Delivered, Gestational Hypertension, and Anemia                                              Post partum procedures: none Augmentation: AROM, Pitocin, and IP Foley Complications: None  Hospital course: Induction of Labor With Vaginal Delivery   25 y.o. yo G1P0000 at [redacted]w[redacted]d was admitted to the hospital 01/02/2021 for induction of labor.  Indication for induction: Gestational hypertension.  Patient had an uncomplicated labor course as follows: Membrane Rupture Time/Date: 6:16 AM ,01/03/2021   Delivery Method:Vaginal, Spontaneous  Episiotomy: None  Lacerations:  Labial  Details of delivery can be found in separate delivery note.  Patient had a routine postpartum course. Patient is discharged home 01/06/21.  Newborn Data: Birth date:01/04/2021  Birth time:4:01 AM  Gender:Female  Living status:Living  Apgars:8 ,9  Weight:3201 g   Magnesium Sulfate received: No BMZ received: No Rhophylac:N/A MMR:N/A T-DaP:Given prenatally Flu: No Transfusion:No  Physical exam  Vitals:   01/04/21 2046 01/05/21 0430 01/05/21 1306 01/05/21 1950  BP: 123/80 116/80 117/74 114/76  Pulse: 77 81 86 83  Resp: $Remo'18 18 18 16  'rcoOa$ Temp: 98 F (36.7 C) 98.3 F (36.8 C) 98.7 F (37.1 C) 98.4 F (36.9 C)  TempSrc: Oral Oral Oral Oral  SpO2: 100% 100% 99% 100%  Weight:      Height:       General: alert, cooperative, and no distress Lochia:  appropriate Uterine Fundus: firm Incision: N/A DVT Evaluation: No evidence of DVT seen on physical exam. No cords or calf tenderness. No significant calf/ankle edema. Labs: Lab Results  Component Value Date   WBC 9.5 01/03/2021   HGB 10.2 (L) 01/03/2021   HCT 30.6 (L) 01/03/2021   MCV 92.4 01/03/2021   PLT 214 01/03/2021   CMP Latest Ref Rng & Units 01/02/2021  Glucose 70 - 99 mg/dL 79  BUN 6 - 20 mg/dL <5(L)  Creatinine 0.44 - 1.00 mg/dL 0.57  Sodium 135 - 145 mmol/L 136  Potassium 3.5 - 5.1 mmol/L 3.4(L)  Chloride 98 - 111 mmol/L 106  CO2 22 - 32 mmol/L 24  Calcium 8.9 - 10.3 mg/dL 8.5(L)  Total Protein 6.5 - 8.1 g/dL 5.4(L)  Total Bilirubin 0.3 - 1.2 mg/dL 0.4  Alkaline Phos 38 - 126 U/L 112  AST 15 - 41 U/L 16  ALT 0 - 44 U/L 10   Edinburgh Score: Edinburgh Postnatal Depression Scale Screening Tool 01/05/2021  I have been able to laugh and see the funny side of things. 0  I have looked forward with enjoyment to things. 0  I have blamed myself unnecessarily when things went wrong. 0  I have been anxious or worried for no good reason. 0  I have felt scared or panicky for no good reason. 0  Things have been getting on top of me.  1  I have been so unhappy that I have had difficulty sleeping. 0  I have felt sad or miserable. 0  I have been so unhappy that I have been crying. 0  The thought of harming myself has occurred to me. 0  Edinburgh Postnatal Depression Scale Total 1     After visit meds:  Allergies as of 01/06/2021       Reactions   Venofer [iron Sucrose] Hives, Itching, Nausea And Vomiting, Swelling, Other (See Comments)   Chills, swelling of both hands and feet, low back pain, chest tightness on inhalation        Medication List     STOP taking these medications    aspirin EC 81 MG tablet   ferrous sulfate 325 (65 FE) MG tablet       TAKE these medications    acetaminophen 500 MG tablet Commonly known as: TYLENOL Take 500 mg by mouth every  6 (six) hours as needed.   Blood Pressure Kit Devi 1 Device by Does not apply route as needed.   coconut oil Oil Apply 1 application topically as needed (nipple pain).   ibuprofen 600 MG tablet Commonly known as: ADVIL Take 1 tablet (600 mg total) by mouth every 6 (six) hours.   polyethylene glycol 17 g packet Commonly known as: MIRALAX / GLYCOLAX Take 17 g by mouth daily as needed.   prenatal multivitamin Tabs tablet Take 1 tablet by mouth daily at 12 noon.         Discharge home in stable condition Infant Feeding: Bottle Infant Disposition:home with mother Discharge instruction: per After Visit Summary and Postpartum booklet. Activity: Advance as tolerated. Pelvic rest for 6 weeks.  Diet: routine diet Future Appointments: Future Appointments  Date Time Provider Department Center  01/06/2021  2:35 PM Starr Lake, CNM [facility]   Follow up Visit: Message sent to [facility] 01/04/21 by Sylvester Harder.   Please schedule this patient for a In person postpartum visit in 6 weeks with the following provider: Any provider. Additional Postpartum F/U:BP check 1 week  High risk pregnancy complicated by: HTN Delivery mode:  Vaginal, Spontaneous  Anticipated Birth Control:  Unsure (considering outpatient Paraguard IUD)  Tevita Gomer, Gildardo Cranker, MD OB Fellow, Faculty Practice 01/06/2021 6:41 AM

## 2021-01-04 NOTE — Progress Notes (Signed)
Labor Progress Note Mylene Bow is a 25 y.o. G1P0000 at [redacted]w[redacted]d presented for IOL-gHTN.  S: Comfortable with epidural.  O:  BP (!) 144/89   Pulse 94   Temp 99.6 F (37.6 C) (Oral)   Resp 16   Ht 5\' 1"  (1.549 m)   Wt 61.5 kg   LMP 04/13/2020   SpO2 99%   BMI 25.62 kg/m  EFM: baseline 145/moderate variability/+accels/no decels IUPC: every 1-50min  CVE: Dilation: Lip/rim Effacement (%): 90 Cervical Position: Middle Station: 0, Plus 1 Presentation: Vertex Exam by:: M Hopkins RN / [phone] MD  A&P: 25 y.o. G1P0000 [redacted]w[redacted]d IOL gHTN #IOL: S/p FB. Pitocin started at 2245 on 6/23. AROM with clear fluid at 0615, currently at 10 mu/min. Patient still has a small amount of cervix, will increase pitocin and dose benadryl given some mild cervical swelling. #Pain: epidural #FWB: category I #GBS positive, PCN #gHTN: intermittent mild range BP, asymptomatic, PEC labs nl  7/23, MD 12:46 AM

## 2021-01-05 MED ORDER — POLYETHYLENE GLYCOL 3350 17 G PO PACK
17.0000 g | PACK | Freq: Every day | ORAL | Status: DC
  Administered 2021-01-05: 17 g via ORAL
  Filled 2021-01-05: qty 1

## 2021-01-05 NOTE — Progress Notes (Signed)
POSTPARTUM PROGRESS NOTE  Subjective: Sydney Rice is a 25 y.o. G1P1001 s/p vaginal delivery at [redacted]w[redacted]d.  She reports she doing well. No acute events overnight. She denies any problems with ambulating, voiding or po intake. Denies nausea or vomiting. She has passed flatus. Pain is well controlled.  Lochia is minimal.  Objective: Blood pressure 116/80, pulse 81, temperature 98.3 F (36.8 C), temperature source Oral, resp. rate 18, height 5\' 1"  (1.549 m), weight 61.5 kg, last menstrual period 04/13/2020, SpO2 100 %, unknown if currently breastfeeding.  Physical Exam:  General: alert, cooperative and no distress Chest: no respiratory distress Abdomen: soft, non-tender  Uterine Fundus: firm and at level of umbilicus Extremities: No calf swelling or tenderness  no LE edema  Recent Labs    01/02/21 1438 01/03/21 0749  HGB 11.3* 10.2*  HCT 34.3* 30.6*    Assessment/Plan: Sydney Rice is a 25 y.o. G1P1001 s/p vaginal delivery at [redacted]w[redacted]d.  Routine Postpartum Care: Doing well, pain well-controlled.  -- Continue routine care, lactation support  -- Contraception: currently undecided; considering outpatient Copper IUD -- Feeding: breast+formula; plan for lactation consultation today -- gHTN: Blood pressure wnl. Pt asymptomatic. Will continue to monitor.  Dispo: Plan for discharge PPD#2.  [redacted]w[redacted]d, MD OB Fellow, Faculty Practice 01/05/2021 5:53 AM

## 2021-01-05 NOTE — Plan of Care (Signed)
  Problem: Education: Goal: Knowledge of condition will improve Outcome: Completed/Met   Problem: Life Cycle: Goal: Chance of risk for complications during the postpartum period will decrease Outcome: Completed/Met   Problem: Role Relationship: Goal: Ability to demonstrate positive interaction with newborn will improve Outcome: Completed/Met

## 2021-01-05 NOTE — Lactation Note (Signed)
This note was copied from a baby's chart. Lactation Consultation Note  Patient Name: Sydney Rice [id number] Date: 01/05/2021 Reason for consult: Follow-up assessment;Early term 37-38.6wks;Primapara;1st time breastfeeding Age:24 hours   P1 mother whose infant is now 36 hours old.  This is an ETI at 38+0 weeks.  RN in room prior to my arrival and had assisted with breast feeding.  Mother able to express an additional 2 mls of colostrum and had it reserved on a spoon at bedside.  Suggested she feed back any EBM she obtains to baby and offered to assist.  Demonstrated swaddling and spoon fed the colostrum; baby burped.  Placed back in the bassinet per mother's request.  Reviewed breast feeding basics and reassurance provided.  Mother is very receptive to teaching and desires to obtain as much knowledge as possible.  Discussed cluster feeding and voids/stools.  Mother reported that baby latches well; denies pain with latching.  Encouraged to call for latch assistance as needed.  No support person present at this time.  RN updated.   Maternal Data Has patient been taught Hand Expression?: Yes Does the patient have breastfeeding experience prior to this delivery?: No  Feeding Mother's Current Feeding Choice: Breast Milk  LATCH Score Latch: Repeated attempts needed to sustain latch, nipple held in mouth throughout feeding, stimulation needed to elicit sucking reflex.  Audible Swallowing: A few with stimulation  Type of Nipple: Everted at rest and after stimulation  Comfort (Breast/Nipple): Soft / non-tender  Hold (Positioning): Assistance needed to correctly position infant at breast and maintain latch.  LATCH Score: 7   Lactation Tools Discussed/Used    Interventions    Discharge Pump: Manual WIC Program: No  Consult Status Consult Status: Follow-up Date: 01/06/21 Follow-up type: In-patient    Dora Sims 01/05/2021, 6:32 AM

## 2021-01-05 NOTE — Anesthesia Postprocedure Evaluation (Signed)
Anesthesia Post Note  Patient: Sydney Rice  Procedure(s) Performed: AN AD HOC LABOR EPIDURAL     Patient location during evaluation: Mother Baby Anesthesia Type: Epidural Level of consciousness: awake and alert, oriented and patient cooperative Pain management: pain level controlled Vital Signs Assessment: post-procedure vital signs reviewed and stable Respiratory status: spontaneous breathing Cardiovascular status: stable Postop Assessment: no headache, epidural receding, patient able to bend at knees and no signs of nausea or vomiting Anesthetic complications: no Comments: [name].  Reports "water leaking" from back. Back examined and a 2x2 guaze dressing was noted to be clean and dry.  [name].  Pain score 3.    No notable events documented.  Last Vitals:  Vitals:   01/04/21 2046 01/05/21 0430  BP: 123/80 116/80  Pulse: 77 81  Resp: 18 18  Temp: 36.7 C 36.8 C  SpO2: 100% 100%    Last Pain:  Vitals:   01/05/21 0843  TempSrc:   PainSc: 0-No pain   Pain Goal: Patients Stated Pain Goal: 0 (01/03/21 3762)              Epidural/Spinal Function Cutaneous sensation: Normal sensation (01/05/21 0843), Patient able to flex knees: Yes (01/05/21 0843), Patient able to lift hips off bed: Yes (01/05/21 0843), Back pain beyond tenderness at insertion site: No (01/05/21 0843), Progressively worsening motor and/or sensory loss: No (01/05/21 0843), Bowel and/or bladder incontinence post epidural: No (01/05/21 0843)  Merrilyn Puma

## 2021-01-05 NOTE — Plan of Care (Signed)
  Problem: Education: Goal: Knowledge of condition will improve Outcome: Completed/Met Goal: Individualized Educational Video(s) Outcome: Completed/Met   Problem: Activity: Goal: Will verbalize the importance of balancing activity with adequate rest periods Outcome: Completed/Met   Problem: Life Cycle: Goal: Chance of risk for complications during the postpartum period will decrease Outcome: Completed/Met   Problem: Role Relationship: Goal: Ability to demonstrate positive interaction with newborn will improve Outcome: Completed/Met

## 2021-01-05 NOTE — Lactation Note (Signed)
This note was copied from a baby's chart. Lactation Consultation Note  Patient Name: Sydney Rice [id number] Date: 01/05/2021 Reason for consult: Follow-up assessment;Primapara;1st time breastfeeding;Early term 37-38.6wks Age:24 hours   LC Follow Up Visit:  Visited with mother early a.m. : Following up to be sure mother had no further questions/concerns.  Mother feels like her son is latching well. Last LATCH score was a 9.  She continues to obtain a good latch and reports no pain with feedings.  Suggested mother use her EBM and coconut oil for nipple comfort.  Also provided comfort gels with instructions for use.  Mother placed comfort gels to breasts and expressed relief: very appreciative.  Encouraged her to call for assistance as needed.  Mother has a DEBP for home use. Support person present.   Maternal Data Has patient been taught Hand Expression?: Yes Does the patient have breastfeeding experience prior to this delivery?: No  Feeding Mother's Current Feeding Choice: Breast Milk  LATCH Score Latch: Grasps breast easily, tongue down, lips flanged, rhythmical sucking.  Audible Swallowing: A few with stimulation  Type of Nipple: Everted at rest and after stimulation  Comfort (Breast/Nipple): Soft / non-tender  Hold (Positioning): No assistance needed to correctly position infant at breast.  LATCH Score: 9   Lactation Tools Discussed/Used Tools: Pump;Coconut oil;Comfort gels Breast pump type: Manual Reason for Pumping: Prn Pumping frequency: prn  Interventions    Discharge Pump: Personal;Manual WIC Program: No  Consult Status Consult Status: Follow-up Date: 01/06/21 Follow-up type: In-patient    Jonathan Corpus R Nyala Kirchner 01/05/2021, 2:11 PM

## 2021-01-06 ENCOUNTER — Encounter: Payer: Medicaid Other | Admitting: Student

## 2021-01-06 MED ORDER — COCONUT OIL OIL: 1.0000 "application " | TOPICAL_OIL | 0 refills | Status: DC | PRN

## 2021-01-06 MED ORDER — IBUPROFEN 600 MG PO TABS: 600.0000 mg | ORAL_TABLET | Freq: Four times a day (QID) | ORAL | 0 refills | Status: DC

## 2021-01-06 MED ORDER — POLYETHYLENE GLYCOL 3350 17 G PO PACK: 17.0000 g | PACK | Freq: Every day | ORAL | 0 refills | Status: DC | PRN

## 2021-01-06 NOTE — Lactation Note (Addendum)
This note was copied from a baby's chart. Lactation Consultation Note  Patient Name: Sydney Rice [id number] Date: 01/06/2021 Reason for consult: Follow-up assessment Age:24 hours  Mother's breasts are filling. Answered questions.  Reviewed engorgement care and monitoring voids/stools. Mother is alternating with comfort gels and coconut oil for sore nipples.   Interventions Interventions: Breast feeding basics reviewed;Education  Discharge Discharge Education: Engorgement and breast care;Warning signs for feeding baby  Consult Status Consult Status: Complete Date: 01/06/21   Dahlia Byes [facility] 01/06/2021, 10:07 AM

## 2021-01-07 LAB — SURGICAL PATHOLOGY

## 2021-01-09 ENCOUNTER — Inpatient Hospital Stay (HOSPITAL_COMMUNITY)
Admission: AD | Admit: 2021-01-09 | Discharge: 2021-01-11 | DRG: 776 | Disposition: A | Discharge status: Home / Self Care | Payer: Medicaid Other | Attending: Obstetrics & Gynecology | Admitting: Obstetrics & Gynecology

## 2021-01-09 ENCOUNTER — Other Ambulatory Visit: Payer: Self-pay

## 2021-01-09 ENCOUNTER — Encounter (HOSPITAL_COMMUNITY): Payer: Self-pay | Admitting: Obstetrics & Gynecology

## 2021-01-09 ENCOUNTER — Ambulatory Visit (INDEPENDENT_AMBULATORY_CARE_PROVIDER_SITE_OTHER): Payer: Medicaid Other

## 2021-01-09 ENCOUNTER — Telehealth: Payer: Self-pay | Admitting: Obstetrics & Gynecology

## 2021-01-09 VITALS — BP 147/96 | HR 60 | Ht 61.0 in | Wt 121.3 lb

## 2021-01-09 DIAGNOSIS — R030 Elevated blood-pressure reading, without diagnosis of hypertension: Secondary | ICD-10-CM

## 2021-01-09 DIAGNOSIS — Z20822 Contact with and (suspected) exposure to covid-19: Secondary | ICD-10-CM | POA: Diagnosis present

## 2021-01-09 DIAGNOSIS — O1415 Severe pre-eclampsia, complicating the puerperium: Secondary | ICD-10-CM | POA: Diagnosis not present

## 2021-01-09 DIAGNOSIS — O1495 Unspecified pre-eclampsia, complicating the puerperium: Secondary | ICD-10-CM | POA: Diagnosis not present

## 2021-01-09 DIAGNOSIS — R519 Headache, unspecified: Secondary | ICD-10-CM | POA: Diagnosis present

## 2021-01-09 LAB — RESP PANEL BY RT-PCR (FLU A&B, COVID) ARPGX2
Influenza A by PCR: NEGATIVE
Influenza B by PCR: NEGATIVE
SARS Coronavirus 2 by RT PCR: NEGATIVE

## 2021-01-09 MED ORDER — ACETAMINOPHEN 325 MG PO TABS: 650.0000 mg | ORAL_TABLET | ORAL | Status: DC | PRN

## 2021-01-09 MED ORDER — MAGNESIUM SULFATE 40 GM/1000ML IV SOLN
2.0000 g/h | INTRAVENOUS | Status: AC
  Administered 2021-01-10: 2 g/h via INTRAVENOUS
  Filled 2021-01-09: qty 1000

## 2021-01-09 MED ORDER — LACTATED RINGERS IV SOLN: INTRAVENOUS | Status: DC

## 2021-01-09 MED ORDER — LABETALOL HCL 5 MG/ML IV SOLN: 20.0000 mg | INTRAVENOUS | Status: DC | PRN

## 2021-01-09 MED ORDER — SIMETHICONE 80 MG PO CHEW
80.0000 mg | CHEWABLE_TABLET | ORAL | Status: DC | PRN
  Filled 2021-01-09: qty 1

## 2021-01-09 MED ORDER — DIPHENHYDRAMINE HCL 25 MG PO CAPS: 25.0000 mg | ORAL_CAPSULE | Freq: Four times a day (QID) | ORAL | Status: DC | PRN

## 2021-01-09 MED ORDER — IBUPROFEN 600 MG PO TABS
600.0000 mg | ORAL_TABLET | Freq: Four times a day (QID) | ORAL | Status: DC
  Administered 2021-01-09 – 2021-01-11 (×5): 600 mg via ORAL
  Filled 2021-01-09 (×5): qty 1

## 2021-01-09 MED ORDER — HYDRALAZINE HCL 50 MG PO TABS
25.0000 mg | ORAL_TABLET | Freq: Three times a day (TID) | ORAL | Status: DC | PRN
  Administered 2021-01-09: 25 mg via ORAL
  Filled 2021-01-09 (×2): qty 1

## 2021-01-09 MED ORDER — ONDANSETRON HCL 4 MG/2ML IJ SOLN
4.0000 mg | INTRAMUSCULAR | Status: DC | PRN
Start: 2021-01-09 — End: 2021-01-11

## 2021-01-09 MED ORDER — TETANUS-DIPHTH-ACELL PERTUSSIS 5-2.5-18.5 LF-MCG/0.5 IM SUSY: 0.5000 mL | PREFILLED_SYRINGE | Freq: Once | INTRAMUSCULAR | Status: DC

## 2021-01-09 MED ORDER — COCONUT OIL OIL: 1.0000 "application " | TOPICAL_OIL | Status: DC | PRN

## 2021-01-09 MED ORDER — ZOLPIDEM TARTRATE 5 MG PO TABS: 5.0000 mg | ORAL_TABLET | Freq: Every evening | ORAL | Status: DC | PRN

## 2021-01-09 MED ORDER — LABETALOL HCL 5 MG/ML IV SOLN: 80.0000 mg | INTRAVENOUS | Status: DC | PRN

## 2021-01-09 MED ORDER — SENNOSIDES-DOCUSATE SODIUM 8.6-50 MG PO TABS
2.0000 | ORAL_TABLET | Freq: Every day | ORAL | Status: DC
  Administered 2021-01-11: 2 via ORAL
  Filled 2021-01-09: qty 2

## 2021-01-09 MED ORDER — ONDANSETRON HCL 4 MG PO TABS: 4.0000 mg | ORAL_TABLET | ORAL | Status: DC | PRN

## 2021-01-09 MED ORDER — MAGNESIUM SULFATE 40 GM/1000ML IV SOLN
INTRAVENOUS | Status: AC
  Filled 2021-01-09: qty 1000

## 2021-01-09 MED ORDER — WITCH HAZEL-GLYCERIN EX PADS: 1.0000 "application " | MEDICATED_PAD | CUTANEOUS | Status: DC | PRN

## 2021-01-09 MED ORDER — MAGNESIUM SULFATE BOLUS VIA INFUSION
4.0000 g | Freq: Once | INTRAVENOUS | Status: AC
  Administered 2021-01-09: 4 g via INTRAVENOUS
  Filled 2021-01-09: qty 1000

## 2021-01-09 MED ORDER — ENOXAPARIN SODIUM 40 MG/0.4ML IJ SOSY
40.0000 mg | PREFILLED_SYRINGE | Freq: Every day | INTRAMUSCULAR | Status: DC
  Administered 2021-01-09: 40 mg via SUBCUTANEOUS
  Filled 2021-01-09 (×2): qty 0.4

## 2021-01-09 MED ORDER — DIBUCAINE (PERIANAL) 1 % EX OINT: 1.0000 "application " | TOPICAL_OINTMENT | CUTANEOUS | Status: DC | PRN

## 2021-01-09 MED ORDER — BENZOCAINE-MENTHOL 20-0.5 % EX AERO: 1.0000 "application " | INHALATION_SPRAY | CUTANEOUS | Status: DC | PRN

## 2021-01-09 MED ORDER — NIFEDIPINE ER OSMOTIC RELEASE 30 MG PO TB24
30.0000 mg | ORAL_TABLET | Freq: Every day | ORAL | Status: DC
  Administered 2021-01-09 – 2021-01-11 (×3): 30 mg via ORAL
  Filled 2021-01-09 (×4): qty 1

## 2021-01-09 MED ORDER — LABETALOL HCL 5 MG/ML IV SOLN: 40.0000 mg | INTRAVENOUS | Status: DC | PRN

## 2021-01-09 NOTE — Telephone Encounter (Signed)
Called pt; VM left stating I am calling pt to set up appt for blood pressure follow up. Requested pt call back or respond to MyChart message.

## 2021-01-09 NOTE — Progress Notes (Signed)
Pt here today for BP check. Pt delivered vaginally on 01/04/21. Pt states having headaches that started around Monday night after getting home from hospital. Pt states not fully relieved with Tylenol. Pt has +1 pitting edema in feet bilaterally today.   BP today in office: 147/96   Reviewed with Dr. Donavan Foil  Advised to go to MAU for evaluation. Pt verbalized understanding and agreeable to plan of care.  Called MAU to make aware of pt's arrival.    Pt has PP visit scheduled on  02/13/21. Pt aware.   Judeth Cornfield, RN

## 2021-01-09 NOTE — H&P (Signed)
Sydney Rice is a 25 y.o. female presenting for headache, spots in vision and elevated blood pressure. Pt had gHTN during pregnancy and was discharged from the hospital with normal blood pressures on 01/06/2021. Patient had elevated pressures at home, and was seen in the office for a BP check with elevated pressures and told to come to MAU for evaluation. Patient to be admitted to OBS for severe preeclampsia.  OB History     Gravida  1   Para  1   Term  1   Preterm  0   AB  0   Living  1      SAB  0   IAB  0   Ectopic  0   Multiple  0   Live Births  1          Past Medical History:  Diagnosis Date   Hypertension    Medical history non-contributory    Past Surgical History:  Procedure Laterality Date   NO PAST SURGERIES     Family History: family history includes Hypertension in her mother. Social History:  reports that she has never smoked. She has never used smokeless tobacco. She reports previous alcohol use. She reports previous drug use. Drug: Marijuana.     Maternal Diabetes: No Genetic Screening: Normal Maternal Substance Abuse:  No Significant Maternal Medications:  None Significant Maternal Lab Results:  None Other Comments:   gHTN  Review of Systems  Constitutional:  Negative for chills, diaphoresis, fatigue and fever.  Eyes:  Positive for visual disturbance.  Respiratory:  Negative for shortness of breath.   Cardiovascular:  Negative for chest pain.  Gastrointestinal:  Negative for abdominal pain, constipation, diarrhea, nausea and vomiting.  Genitourinary:  Negative for dysuria, flank pain, frequency, pelvic pain, urgency, vaginal bleeding and vaginal discharge.  Neurological:  Positive for headaches. Negative for dizziness, weakness and light-headedness.  Maternal Medical History:  Reason for admission: Nausea.     Blood pressure (!) 152/96, pulse 62, temperature 98.9 F (37.2 C), resp. rate 18, height 5\' 1"  (1.549 m), weight 55.3  kg, currently breastfeeding.  Patient Vitals for the past 24 hrs:  BP Temp Pulse Resp Height Weight  01/09/21 2031 (!) 152/96 -- 62 -- -- --  01/09/21 2016 (!) 158/100 -- 61 -- -- --  01/09/21 2006 (!) 154/93 -- 65 -- -- --  01/09/21 1943 (!) 164/98 98.9 F (37.2 C) (!) 58 18 5\' 1"  (1.549 m) 55.3 kg   Exam Physical Exam Vitals and nursing note reviewed.  Constitutional:      General: She is not in acute distress.    Appearance: Normal appearance. She is not ill-appearing, toxic-appearing or diaphoretic.  HENT:     Head: Normocephalic and atraumatic.  Pulmonary:     Effort: Pulmonary effort is normal.  Neurological:     Mental Status: She is alert and oriented to person, place, and time.  Psychiatric:        Mood and Affect: Mood normal.        Behavior: Behavior normal.        Thought Content: Thought content normal.        Judgment: Judgment normal.    Prenatal labs: ABO, Rh: --/--/O POS (06/23 1417) Antibody: NEG (06/23 1417) Rubella: 4.03 (01/04 1513) RPR: NON REACTIVE (06/23 1438)  HBsAg: Negative (01/04 1513)  HIV: Non Reactive (04/18 05-21-1968)  GBS: Positive/-- (06/16 0949)   Assessment/Plan: 1. Severe pre-eclampsia, postpartum condition or complication    -admit  to OBS  Odie Sera Rachelanne Whidby 01/09/2021, 8:56 PM

## 2021-01-09 NOTE — MAU Note (Signed)
Pt report her b/p was 149/96. Told to come in to be evaluated.C/O slight headache and seeing spot . Feet are swollen but they have been since delivery.

## 2021-01-09 NOTE — Progress Notes (Signed)
Patient was assessed and managed by nursing staff during this encounter. I have reviewed the chart and agree with the documentation and plan. I have also made any necessary editorial changes.  Delesa Kawa A Demarco Bacci, MD 01/09/2021 5:09 PM   

## 2021-01-09 NOTE — Telephone Encounter (Signed)
     Faculty Practice OB/GYN Physician Phone Call Documentation  I received a phone call from [employer] of elevated BP of 149/93  associated with headache x 1 day, and abdominal pain.  I had a phone conversation with Ottilie Wigglesworth about her symptoms; she reports that her abdominal pain is the routine lower abdominal pain she had since vaginal delivery on 01/04/21, no epigastric/RUQ pain.  She does endorse a moderate headache not fully alleviated by Tylenol. Of note, patient was discharged with diagnosis of GHTN, but did not require any antihypertensives at discharge on 01/06/21.  She was told to come in to [facility] MAU for evaluation and management; advised that she may meet criteria for severe preeclampsia and need admission or just get treatment for her BP management.  Patient reported she was unable to come in until later this morning. She was cautioned about concern about worsening headaches/BP, leading to eclampsia/seizures, strokes or worse.  [facility] and MAU staff notified.   Jaynie Collins, MD, FACOG Obstetrician & Gynecologist, [facility] for [facility] 01/09/2021  1:38 AM

## 2021-01-10 DIAGNOSIS — O1495 Unspecified pre-eclampsia, complicating the puerperium: Secondary | ICD-10-CM

## 2021-01-10 LAB — CBC
HCT: 28.9 % — ABNORMAL LOW (ref 36.0–46.0)
Hemoglobin: 9.7 g/dL — ABNORMAL LOW (ref 12.0–15.0)
MCH: 31.2 pg (ref 26.0–34.0)
MCHC: 33.6 g/dL (ref 30.0–36.0)
MCV: 92.9 fL (ref 80.0–100.0)
Platelets: 323 10*3/uL (ref 150–400)
RBC: 3.11 MIL/uL — ABNORMAL LOW (ref 3.87–5.11)
RDW: 14.1 % (ref 11.5–15.5)
WBC: 7.6 10*3/uL (ref 4.0–10.5)
nRBC: 0 % (ref 0.0–0.2)

## 2021-01-10 LAB — COMPREHENSIVE METABOLIC PANEL
ALT: 38 U/L (ref 0–44)
AST: 33 U/L (ref 15–41)
Albumin: 3.2 g/dL — ABNORMAL LOW (ref 3.5–5.0)
Alkaline Phosphatase: 93 U/L (ref 38–126)
Anion gap: 9 (ref 5–15)
BUN: 6 mg/dL (ref 6–20)
CO2: 23 mmol/L (ref 22–32)
Calcium: 8.3 mg/dL — ABNORMAL LOW (ref 8.9–10.3)
Chloride: 107 mmol/L (ref 98–111)
Creatinine, Ser: 0.57 mg/dL (ref 0.44–1.00)
GFR, Estimated: >60 mL/min (ref >60)
Glucose, Bld: 75 mg/dL (ref 70–99)
Potassium: 3.2 mmol/L — ABNORMAL LOW (ref 3.5–5.1)
Sodium: 139 mmol/L (ref 135–145)
Total Bilirubin: 0.5 mg/dL (ref 0.3–1.2)
Total Protein: 6.4 g/dL — ABNORMAL LOW (ref 6.5–8.1)

## 2021-01-10 MED ORDER — ACETAMINOPHEN 500 MG PO TABS
1000.0000 mg | ORAL_TABLET | Freq: Four times a day (QID) | ORAL | Status: DC | PRN
  Administered 2021-01-10: 1000 mg via ORAL
  Filled 2021-01-10: qty 2

## 2021-01-10 NOTE — Progress Notes (Signed)
Patient reporting "cramping" type feeling when she urinates. Denies any burning, urgency or frequency. Dr. Shawnie Pons notified.  No new orders given. Carmelina Dane, RN

## 2021-01-10 NOTE — Plan of Care (Signed)
  Problem: Education: Goal: Knowledge of disease or condition will improve Outcome: Progressing Goal: Knowledge of the prescribed therapeutic regimen will improve Outcome: Progressing   Problem: Fluid Volume: Goal: Peripheral tissue perfusion will improve Outcome: Progressing   Problem: Clinical Measurements: Goal: Complications related to disease process, condition or treatment will be avoided or minimized Outcome: Progressing   

## 2021-01-10 NOTE — Progress Notes (Signed)
POSTPARTUM PROGRESS NOTE  PPD #6  Subjective:  Sydney Rice is a 25 y.o. G1P1001 s/p NSVD on 6/25 readmitted due to postpartum preeclampsia.  She is feeling a little better today- stilll notes headache 5/10. Vision improved.  No RUQ pain.   She denies any problems with ambulating, voiding or po intake. Denies nausea or vomiting. Lochia minimal Denies fever/chills/chest pain/SOB.    Objective: Blood pressure (!) 134/93, pulse 80, temperature 98 F (36.7 C), temperature source Oral, resp. rate 18, height 5\' 1"  (1.549 m), weight 55.3 kg, SpO2 100 %, currently breastfeeding.  BP range: 129-164/77-100  Physical Exam:  General: alert, cooperative and no distress Chest: no respiratory distress Heart: regular rate and rhythm Abdomen: soft, non-tender Uterine Fundus: firm, non-tender DVT Evaluation: No calf swelling or tenderness Extremities: no edema Skin: warm, dry  Results for orders placed or performed during the hospital encounter of 01/09/21 (from the past 24 hour(s))  Resp Panel by RT-PCR (Flu A&B, Covid) Nasopharyngeal Swab     Status: None   Collection Time: 01/09/21  9:31 PM   Specimen: Nasopharyngeal Swab; Nasopharyngeal(NP) swabs in vial transport medium  Result Value Ref Range   SARS Coronavirus 2 by RT PCR NEGATIVE NEGATIVE   Influenza A by PCR NEGATIVE NEGATIVE   Influenza B by PCR NEGATIVE NEGATIVE  Comprehensive metabolic panel     Status: Abnormal   Collection Time: 01/10/21  4:50 AM  Result Value Ref Range   Sodium 139 135 - 145 mmol/L   Potassium 3.2 (L) 3.5 - 5.1 mmol/L   Chloride 107 98 - 111 mmol/L   CO2 23 22 - 32 mmol/L   Glucose, Bld 75 70 - 99 mg/dL   BUN 6 6 - 20 mg/dL   Creatinine, Ser 03/13/21 0.44 - 1.00 mg/dL   Calcium 8.3 (L) 8.9 - 10.3 mg/dL   Total Protein 6.4 (L) 6.5 - 8.1 g/dL   Albumin 3.2 (L) 3.5 - 5.0 g/dL   AST 33 15 - 41 U/L   ALT 38 0 - 44 U/L   Alkaline Phosphatase 93 38 - 126 U/L   Total Bilirubin 0.5 0.3 - 1.2 mg/dL   GFR,  Estimated 2.77 >82 mL/min   Anion gap 9 5 - 15  CBC     Status: Abnormal   Collection Time: 01/10/21  4:50 AM  Result Value Ref Range   WBC 7.6 4.0 - 10.5 K/uL   RBC 3.11 (L) 3.87 - 5.11 MIL/uL   Hemoglobin 9.7 (L) 12.0 - 15.0 g/dL   HCT 03/13/21 (L) 35.3 - 61.4 %   MCV 92.9 80.0 - 100.0 fL   MCH 31.2 26.0 - 34.0 pg   MCHC 33.6 30.0 - 36.0 g/dL   RDW 43.1 54.0 - 08.6 %   Platelets 323 150 - 400 K/uL   nRBC 0.0 0.0 - 0.2 %    Assessment/Plan: Sydney Rice is a 25 y.o. G1P1001 PPD #6- admitted for postpartum preeclampsia with severe features  1) Preeclampsia -currently on Mag x 24hr -Procardia XL 30mg  daily, BP stable -headache noted, plan for tylenol this am, will continue to monitor -labs as above  2) continue with routine postpartum care  25, DO Faculty Attending, Center for [facility] 01/10/2021, 6:53 AM

## 2021-01-11 MED ORDER — NIFEDIPINE ER 30 MG PO TB24: 30.0000 mg | ORAL_TABLET | Freq: Every day | ORAL | 1 refills | Status: DC

## 2021-01-11 NOTE — Plan of Care (Signed)
  Problem: Education: Goal: Knowledge of disease or condition will improve Outcome: Adequate for Discharge Goal: Knowledge of the prescribed therapeutic regimen will improve Outcome: Adequate for Discharge   Problem: Fluid Volume: Goal: Peripheral tissue perfusion will improve Outcome: Adequate for Discharge   Problem: Clinical Measurements: Goal: Complications related to disease process, condition or treatment will be avoided or minimized Outcome: Adequate for Discharge

## 2021-01-11 NOTE — Discharge Summary (Signed)
Physician Discharge Summary  Patient ID: Sydney Rice MRN: [id number] DOB/AGE: Nov 20, 1995 25 y.o.  Admit date: 01/09/2021 Discharge date: 01/11/2021   Discharge Diagnoses:  Active Problems:   Preeclampsia in postpartum period   Consults: None  Significant Diagnostic Studies:  Results for orders placed or performed during the hospital encounter of 01/09/21 (from the past 72 hour(s))  Resp Panel by RT-PCR (Flu A&B, Covid) Nasopharyngeal Swab     Status: None   Collection Time: 01/09/21  9:31 PM   Specimen: Nasopharyngeal Swab; Nasopharyngeal(NP) swabs in vial transport medium  Result Value Ref Range   SARS Coronavirus 2 by RT PCR NEGATIVE NEGATIVE    Comment: (NOTE) SARS-CoV-2 target nucleic acids are NOT DETECTED.  The SARS-CoV-2 RNA is generally detectable in upper respiratory specimens during the acute phase of infection. The lowest concentration of SARS-CoV-2 viral copies this assay can detect is 138 copies/mL. A negative result does not preclude SARS-Cov-2 infection and should not be used as the sole basis for treatment or other patient management decisions. A negative result may occur with  improper specimen collection/handling, submission of specimen other than nasopharyngeal swab, presence of viral mutation(s) within the areas targeted by this assay, and inadequate number of viral copies(<138 copies/mL). A negative result must be combined with clinical observations, patient history, and epidemiological information. The expected result is Negative.  Fact Sheet for Patients:  EntrepreneurPulse.com.au  Fact Sheet for Healthcare Providers:  IncredibleEmployment.be  This test is no t yet approved or cleared by the [place] FDA and  has been authorized for detection and/or diagnosis of SARS-CoV-2 by FDA under an Emergency Use Authorization (EUA). This EUA will remain  in effect (meaning this test can be used) for the duration  of the COVID-19 declaration under Section 564(b)(1) of the Act, 21 U.S.C.section 360bbb-3(b)(1), unless the authorization is terminated  or revoked sooner.       Influenza A by PCR NEGATIVE NEGATIVE   Influenza B by PCR NEGATIVE NEGATIVE    Comment: (NOTE) The Xpert Xpress SARS-CoV-2/FLU/RSV plus assay is intended as an aid in the diagnosis of influenza from Nasopharyngeal swab specimens and should not be used as a sole basis for treatment. Nasal washings and aspirates are unacceptable for Xpert Xpress SARS-CoV-2/FLU/RSV testing.  Fact Sheet for Patients: EntrepreneurPulse.com.au  Fact Sheet for Healthcare Providers: IncredibleEmployment.be  This test is not yet approved or cleared by the [place] FDA and has been authorized for detection and/or diagnosis of SARS-CoV-2 by FDA under an Emergency Use Authorization (EUA). This EUA will remain in effect (meaning this test can be used) for the duration of the COVID-19 declaration under Section 564(b)(1) of the Act, 21 U.S.C. section 360bbb-3(b)(1), unless the authorization is terminated or revoked.  Performed at [facility], [place] [address] [place], [place] [address]   Comprehensive metabolic panel     Status: Abnormal   Collection Time: 01/10/21  4:50 AM  Result Value Ref Range   Sodium 139 135 - 145 mmol/L   Potassium 3.2 (L) 3.5 - 5.1 mmol/L   Chloride 107 98 - 111 mmol/L   CO2 23 22 - 32 mmol/L   Glucose, Bld 75 70 - 99 mg/dL    Comment: Glucose reference range applies only to samples taken after fasting for at least 8 hours.   BUN 6 6 - 20 mg/dL   Creatinine, Ser 0.57 0.44 - 1.00 mg/dL   Calcium 8.3 (L) 8.9 - 10.3 mg/dL   Total Protein 6.4 (L) 6.5 -  8.1 g/dL   Albumin 3.2 (L) 3.5 - 5.0 g/dL   AST 33 15 - 41 U/L   ALT 38 0 - 44 U/L   Alkaline Phosphatase 93 38 - 126 U/L   Total Bilirubin 0.5 0.3 - 1.2 mg/dL   GFR, Estimated >60 >60 mL/min    Comment: (NOTE) Calculated  using the CKD-EPI Creatinine Equation (2021)    Anion gap 9 5 - 15    Comment: Performed at [facility] [address], [place], [place] [address]  CBC     Status: Abnormal   Collection Time: 01/10/21  4:50 AM  Result Value Ref Range   WBC 7.6 4.0 - 10.5 K/uL   RBC 3.11 (L) 3.87 - 5.11 MIL/uL   Hemoglobin 9.7 (L) 12.0 - 15.0 g/dL   HCT 28.9 (L) 36.0 - 46.0 %   MCV 92.9 80.0 - 100.0 fL   MCH 31.2 26.0 - 34.0 pg   MCHC 33.6 30.0 - 36.0 g/dL   RDW 14.1 11.5 - 15.5 %   Platelets 323 150 - 400 K/uL   nRBC 0.0 0.0 - 0.2 %    Comment: Performed at [facility], [place] [address] [place], [place] [address]   Hospital Course: Admitted for pp pre-eclampsia with severe features by BP and headache. Received Magnesium x 24 hours. Started on Nifedipine. Symptoms improved. BP improved. Deemed stable for discharge.  Discharge exam: Vitals:   01/10/21 1948 01/10/21 2317 01/11/21 0356 01/11/21 0747  BP: (!) 125/92 129/82 128/82 136/81  Pulse: 92 86 77 68  Resp: $Remo'18 18 18 18  'rFmgo$ Temp: 97.6 F (36.4 C) 98.2 F (36.8 C) 98.2 F (36.8 C) 98.3 F (36.8 C)  TempSrc: Oral Oral Oral Oral  SpO2: 100% 100% 100% 100%  Weight:      Height:      Physical Examination: General appearance - alert, well appearing, and in no distress Neck - supple, no significant adenopathy Chest - normal effort Heart - normal rate and regular rhythm Abdomen - soft, nontender, nondistended, no masses or organomegaly Extremities - peripheral pulses normal, no pedal edema, no clubbing or cyanosis  Disposition: Discharge disposition: 01-Home or Self Care       Discharged Condition: fair  Discharge Instructions     Activity as tolerated   Complete by: As directed    Call MD for:  persistant nausea and vomiting   Complete by: As directed    Call MD for:  severe uncontrolled pain   Complete by: As directed    Call MD for:  temperature >100.4   Complete by: As directed    Diet - low sodium heart healthy    Complete by: As directed    No wound care   Complete by: As directed       Allergies as of 01/11/2021       Reactions   Venofer [iron Sucrose] Hives, Itching, Nausea And Vomiting, Swelling, Other (See Comments)   Chills, swelling of both hands and feet, low back pain, chest tightness on inhalation        Medication List     TAKE these medications    acetaminophen 500 MG tablet Commonly known as: TYLENOL Take 500 mg by mouth every 6 (six) hours as needed for mild pain.   Blood Pressure Kit Devi 1 Device by Does not apply route as needed.   ibuprofen 600 MG tablet Commonly known as: ADVIL Take 1 tablet (600 mg total) by mouth every  6 (six) hours.   NIFEdipine 30 MG 24 hr tablet Commonly known as: ADALAT CC Take 1 tablet (30 mg total) by mouth daily.   prenatal multivitamin Tabs tablet Take 1 tablet by mouth daily at 12 noon.        Follow-up [facility] Follow up in 3 day(s).   Specialty: Obstetrics and Gynecology Why: BP check Contact information: [place] [id number] [phone]                Signed: Donnamae Jude 01/11/2021, 7:53 AM

## 2021-01-11 NOTE — Progress Notes (Signed)
Reviewed discharge postpartum instructions with patient regarding medications, when to call MD/go to MAU, highs and lows of blood pressure, signs and symptoms of pre-e, postpartum course, and to call to schedule 3 day BP check with OB. Pt reported having blood pressure cuff at home and would check daily and if any symptoms. Pt verbalized understanding of discharge postpartum instructions and asked appropriate questions.

## 2021-01-14 ENCOUNTER — Ambulatory Visit: Payer: Medicaid Other

## 2021-01-15 ENCOUNTER — Telehealth (HOSPITAL_COMMUNITY): Payer: Self-pay | Admitting: *Deleted

## 2021-01-15 NOTE — Telephone Encounter (Signed)
Left message for patient to return RN phone call. Deforest Hoyles, RN, 01/15/21, [phone].

## 2021-01-28 NOTE — Progress Notes (Signed)
Chart reviewed for nurse visit. Agree with plan of care.   Jahniyah Revere Lorraine, CNM 01/28/2021 5:22 PM   

## 2021-02-02 ENCOUNTER — Other Ambulatory Visit: Payer: Self-pay | Admitting: Family Medicine

## 2021-02-13 ENCOUNTER — Other Ambulatory Visit: Payer: Self-pay

## 2021-02-13 ENCOUNTER — Encounter: Payer: Self-pay | Admitting: Student

## 2021-02-13 ENCOUNTER — Other Ambulatory Visit (HOSPITAL_COMMUNITY)
Admission: RE | Admit: 2021-02-13 | Discharge: 2021-02-13 | Disposition: A | Discharge status: Home / Self Care | Payer: Medicaid Other | Source: Ambulatory Visit | Attending: Student | Admitting: Student

## 2021-02-13 ENCOUNTER — Ambulatory Visit (INDEPENDENT_AMBULATORY_CARE_PROVIDER_SITE_OTHER): Payer: Medicaid Other | Admitting: Student

## 2021-02-13 NOTE — Progress Notes (Signed)
Post Partum Visit Note  Sydney Rice is a 25 y.o. G36P1001 female who presents for a postpartum visit. She is 5 weeks postpartum following a normal spontaneous vaginal delivery. Pregnancy was complicated by diagnosis of pre-e and treated with mgSo4.  I have fully reviewed the prenatal and intrapartum course. The delivery was at 37/6 gestational weeks.  Anesthesia: epidural. Postpartum course has been . Baby is doing well Baby is feeding by breast. Bleeding no bleeding. Bowel function is normal. Bladder function is  Constipated . Patient is not sexually active. Contraception method is none. Postpartum depression screening: negative.   The pregnancy intention screening data noted above was reviewed. Potential methods of contraception were discussed. The patient elected to proceed with nothing.   Edinburgh Postnatal Depression Scale - 02/13/21 1013       Edinburgh Postnatal Depression Scale:  In the Past 7 Days   I have been able to laugh and see the funny side of things. 0    I have looked forward with enjoyment to things. 0    I have blamed myself unnecessarily when things went wrong. 0    I have been anxious or worried for no good reason. 0    I have felt scared or panicky for no good reason. 0    Things have been getting on top of me. 0    I have been so unhappy that I have had difficulty sleeping. 0    I have felt sad or miserable. 0    I have been so unhappy that I have been crying. 0    The thought of harming myself has occurred to me. 0    Edinburgh Postnatal Depression Scale Total 0             Health Maintenance Due  Topic Date Due   HPV VACCINES (2 - 3-dose series) 06/13/2015   PAP-Cervical Cytology Screening  Never done   PAP SMEAR-Modifier  Never done   INFLUENZA VACCINE  02/10/2021    The following portions of the patient's history were reviewed and updated as appropriate: allergies, current medications, past family history, past medical history, past  social history, past surgical history, and problem list.  Review of Systems Pertinent items are noted in HPI.  Objective:  BP (!) 130/91   Pulse 74   Ht 5\' 1"  (1.549 m)   Wt 105 lb 14.4 oz (48 kg)   Breastfeeding Yes   BMI 20.01 kg/m    General:  alert, cooperative, and no distress   Breasts:  not indicated  Lungs: clear to auscultation bilaterally  Heart:  regular rate and rhythm, S1, S2 normal, no murmur, click, rub or gallop  Abdomen: soft, non-tender; bowel sounds normal; no masses,  no organomegaly   Wound NA  GU exam:  normal       Assessment:   Healthy postpartum exam.   Plan:   Essential components of care per ACOG recommendations:  1.  Mood and well being: Patient with negative depression screening today. Reviewed local resources for support.  - Patient tobacco use? No.   - hx of drug use? No.    2. Infant care and feeding:  -Patient currently breastmilk feeding? Yes, no concerns. She is pumping and bottle feeding. It is going well.  -Social determinants of health (SDOH) reviewed in EPIC. No concerns. The following needs were identified  3. Sexuality, contraception and birth spacing - Patient does not want a pregnancy in the next year.  Desired family size is 1 children.  - Reviewed forms of contraception in tiered fashion. Patient desired no method today.   - Discussed birth spacing of 18 months  4. Sleep and fatigue -Encouraged family/partner/community support of 4 hrs of uninterrupted sleep to help with mood and fatigue  5. Physical Recovery  - Discussed patients delivery and complications. She describes her labor as good. - Patient had a Vaginal, no problems at delivery. Patient had a  no  laceration. Perineal healing reviewed. Patient expressed understanding - Patient has urinary incontinence? No. - Patient is safe to resume physical and sexual activity  6.  Health Maintenance - HM due items addressed Yes - Last pap smear No results found for:  DIAGPAP Pap smear done at today's visit.  -Breast Cancer screening indicated? No.   7. Chronic Disease/Pregnancy Condition follow up: Hypertension -patient did not take her nifedipine today. Advised patient, now that she is in the 4 week mark, to stop taking her nifedipine and return for a BP check in one week. Explained that if patient has elevated BP next week, we will refer her to a PCP to discuss blood pressure management. Reviewed warning signs and when to seek medical help. All questions answered.  - PCP follow up  Marylene Land, CNM [facility], [facility]

## 2021-02-13 NOTE — Patient Instructions (Signed)
-  return for BP check in one week

## 2021-02-13 NOTE — Progress Notes (Signed)
Pt states is not interested in [facility] at this time.

## 2021-02-18 ENCOUNTER — Other Ambulatory Visit: Payer: Self-pay | Admitting: Family Medicine

## 2021-02-18 LAB — CYTOLOGY - PAP: Diagnosis: NEGATIVE

## 2021-02-20 ENCOUNTER — Other Ambulatory Visit: Payer: Self-pay

## 2021-02-20 ENCOUNTER — Ambulatory Visit (INDEPENDENT_AMBULATORY_CARE_PROVIDER_SITE_OTHER): Payer: Medicaid Other

## 2021-02-20 VITALS — BP 127/85 | HR 77 | Ht 61.0 in | Wt 104.1 lb

## 2021-02-20 DIAGNOSIS — Z0130 Encounter for examination of blood pressure without abnormal findings: Secondary | ICD-10-CM

## 2021-02-20 NOTE — Progress Notes (Signed)
Pt here today for PP BP check following NVD on 01/04/21.  Pt had PP visit with Crisoforo Oxford, CNM on 02/13/21 and had elevated BP. Came today for recheck. Pt was taking 30mg  Procardia daily. Was told to stop taking on 8/4 since PP visit. Pt denies any headaches, visual changes or swelling.   BP today: 127/85    Pt advised to continue to monitor symptoms and if having HTN again, to contact 10/4 for referral for PCP. Pt verbalized understanding and agreeable to plan of care.   [place], RN

## 2021-02-20 NOTE — Progress Notes (Signed)
Chart reviewed for nurse visit. Agree with plan of care.   Charissa Knowles N, PA-C 02/20/2021 12:52 PM   

## 2022-05-20 ENCOUNTER — Other Ambulatory Visit: Payer: Self-pay

## 2022-05-20 ENCOUNTER — Inpatient Hospital Stay (HOSPITAL_COMMUNITY)
Admission: AD | Admit: 2022-05-20 | Discharge: 2022-05-20 | Disposition: A | Discharge status: Home / Self Care | Payer: Medicaid Other | Attending: Obstetrics and Gynecology | Admitting: Obstetrics and Gynecology

## 2022-05-20 ENCOUNTER — Inpatient Hospital Stay (HOSPITAL_COMMUNITY): Payer: Medicaid Other

## 2022-05-20 ENCOUNTER — Encounter (HOSPITAL_COMMUNITY): Payer: Self-pay | Admitting: Emergency Medicine

## 2022-05-20 DIAGNOSIS — O26891 Other specified pregnancy related conditions, first trimester: Secondary | ICD-10-CM

## 2022-05-20 DIAGNOSIS — Z3A01 Less than 8 weeks gestation of pregnancy: Secondary | ICD-10-CM | POA: Diagnosis not present

## 2022-05-20 DIAGNOSIS — O219 Vomiting of pregnancy, unspecified: Secondary | ICD-10-CM | POA: Diagnosis present

## 2022-05-20 DIAGNOSIS — R1030 Lower abdominal pain, unspecified: Secondary | ICD-10-CM | POA: Diagnosis present

## 2022-05-20 DIAGNOSIS — R110 Nausea: Secondary | ICD-10-CM | POA: Diagnosis present

## 2022-05-20 DIAGNOSIS — R102 Pelvic and perineal pain: Secondary | ICD-10-CM

## 2022-05-20 DIAGNOSIS — O3680X0 Pregnancy with inconclusive fetal viability, not applicable or unspecified: Secondary | ICD-10-CM

## 2022-05-20 LAB — URINALYSIS, ROUTINE W REFLEX MICROSCOPIC
Bacteria, UA: NONE SEEN
Bilirubin Urine: NEGATIVE
Glucose, UA: NEGATIVE mg/dL
Hgb urine dipstick: NEGATIVE
Ketones, ur: 80 mg/dL — AB
Nitrite: NEGATIVE
Protein, ur: NEGATIVE mg/dL
Specific Gravity, Urine: 1.028 (ref 1.005–1.030)
pH: 5 (ref 5.0–8.0)

## 2022-05-20 LAB — CBC WITH DIFFERENTIAL/PLATELET
Abs Immature Granulocytes: 0.02 10*3/uL (ref 0.00–0.07)
Basophils Absolute: 0 10*3/uL (ref 0.0–0.1)
Basophils Relative: 0 %
Eosinophils Absolute: 0.1 10*3/uL (ref 0.0–0.5)
Eosinophils Relative: 2 %
HCT: 35.8 % — ABNORMAL LOW (ref 36.0–46.0)
Hemoglobin: 12.1 g/dL (ref 12.0–15.0)
Immature Granulocytes: 0 %
Lymphocytes Relative: 36 %
Lymphs Abs: 2.1 10*3/uL (ref 0.7–4.0)
MCH: 30.3 pg (ref 26.0–34.0)
MCHC: 33.8 g/dL (ref 30.0–36.0)
MCV: 89.5 fL (ref 80.0–100.0)
Monocytes Absolute: 0.4 10*3/uL (ref 0.1–1.0)
Monocytes Relative: 7 %
Neutro Abs: 3.2 10*3/uL (ref 1.7–7.7)
Neutrophils Relative %: 55 %
Platelets: 245 10*3/uL (ref 150–400)
RBC: 4 MIL/uL (ref 3.87–5.11)
RDW: 12.6 % (ref 11.5–15.5)
WBC: 5.8 10*3/uL (ref 4.0–10.5)
nRBC: 0 % (ref 0.0–0.2)

## 2022-05-20 LAB — COMPREHENSIVE METABOLIC PANEL
ALT: 10 U/L (ref 0–44)
AST: 16 U/L (ref 15–41)
Albumin: 4.1 g/dL (ref 3.5–5.0)
Alkaline Phosphatase: 47 U/L (ref 38–126)
Anion gap: 10 (ref 5–15)
BUN: 11 mg/dL (ref 6–20)
CO2: 20 mmol/L — ABNORMAL LOW (ref 22–32)
Calcium: 9.7 mg/dL (ref 8.9–10.3)
Chloride: 108 mmol/L (ref 98–111)
Creatinine, Ser: 0.65 mg/dL (ref 0.44–1.00)
GFR, Estimated: >60 mL/min (ref >60)
Glucose, Bld: 94 mg/dL (ref 70–99)
Potassium: 3.5 mmol/L (ref 3.5–5.1)
Sodium: 138 mmol/L (ref 135–145)
Total Bilirubin: 0.8 mg/dL (ref 0.3–1.2)
Total Protein: 7 g/dL (ref 6.5–8.1)

## 2022-05-20 LAB — GC/CHLAMYDIA PROBE AMP (~~LOC~~) NOT AT ARMC
Chlamydia: NEGATIVE
Comment: NEGATIVE
Comment: NORMAL
Neisseria Gonorrhea: NEGATIVE

## 2022-05-20 LAB — LIPASE, BLOOD: Lipase: 28 U/L (ref 11–51)

## 2022-05-20 LAB — WET PREP, GENITAL
Clue Cells Wet Prep HPF POC: NONE SEEN
Sperm: NONE SEEN
Trich, Wet Prep: NONE SEEN
WBC, Wet Prep HPF POC: <10 (ref <10)
Yeast Wet Prep HPF POC: NONE SEEN

## 2022-05-20 LAB — I-STAT BETA HCG BLOOD, ED (MC, WL, AP ONLY): I-stat hCG, quantitative: >2000 m[IU]/mL — ABNORMAL HIGH (ref <5)

## 2022-05-20 LAB — OB RESULTS CONSOLE GBS: GBS: POSITIVE

## 2022-05-20 MED ORDER — ONDANSETRON 4 MG PO TBDP: 4.0000 mg | ORAL_TABLET | Freq: Four times a day (QID) | ORAL | 0 refills | Status: DC | PRN

## 2022-05-20 NOTE — ED Triage Notes (Signed)
Patient reports low abdominal pain / cramping with nausea for several days , no emesis or diarrhea , denies fever or chills .

## 2022-05-20 NOTE — MAU Note (Signed)
Pt says she went to [facility] ER at 12 [place] - for  Lower abd pain- started Wed  Has vag D/C -  clear, white

## 2022-05-20 NOTE — MAU Provider Note (Signed)
Chief Complaint: Abdominal Pain   Event Date/Time   First Provider Initiated Contact with Patient 05/20/22 0213        SUBJECTIVE HPI: Sydney Rice is a 26 y.o. G2P1001 at 53w4dby LMP who presents to maternity admissions reporting lower abdominal cramping, mostly right side, but sometimes on left. Hurts more when she lies on it. No bleeding. Also has some nausea. She denies vaginal bleeding, vaginal itching/burning, urinary symptoms, h/a, dizziness, n/v, or fever/chills.    Abdominal Pain This is a new problem. The current episode started in the past 7 days. The onset quality is gradual. The problem occurs intermittently. The pain is located in the LLQ and RLQ. The quality of the pain is cramping. The abdominal pain does not radiate. Pertinent negatives include no constipation, diarrhea, dysuria, fever or frequency. The pain is aggravated by certain positions. The pain is relieved by Nothing. She has tried nothing for the symptoms.   Emergency Medicine Provider Triage Evaluation Note   SFia Hebert, a 26y.o. female  was evaluated in triage.  Pt complains of lower abdominal pain that has been going on for about 5 days however constant for the past 3 days with associated nausea but no vomiting.  Denies fever, dysuria, vaginal bleeding.  Does endorse vaginal discharge now for about a month.   Past Medical History:  Diagnosis Date   Hypertension    Medical history non-contributory    Past Surgical History:  Procedure Laterality Date   NO PAST SURGERIES     Social History   Socioeconomic History   Marital status: Single    Spouse name: Not on file   Number of children: Not on file   Years of education: Not on file   Highest education level: Not on file  Occupational History   Occupation: Student  Tobacco Use   Smoking status: Never   Smokeless tobacco: Never  Vaping Use   Vaping Use: Never used  Substance and Sexual Activity   Alcohol use: Not Currently   Drug  use: Not Currently    Types: Marijuana    Comment: last smoked 1 yr   Sexual activity: Yes    Birth control/protection: None, Condom  Other Topics Concern   Not on file  Social History Narrative   Not on file   Social Determinants of Health   Financial Resource Strain: Not on file  Food Insecurity: No Food Insecurity (12/26/2020)   Hunger Vital Sign    Worried About Running Out of Food in the Last Year: Never true    RCambridgein the Last Year: Never true  Transportation Needs: No Transportation Needs (12/26/2020)   PRAPARE - THydrologist(Medical): No    Lack of Transportation (Non-Medical): No  Physical Activity: Not on file  Stress: Not on file  Social Connections: Not on file  Intimate Partner Violence: Not on file   No current facility-administered medications on file prior to encounter.   Current Outpatient Medications on File Prior to Encounter  Medication Sig Dispense Refill   acetaminophen (TYLENOL) 500 MG tablet Take 500 mg by mouth every 6 (six) hours as needed for mild pain.     Blood Pressure Monitoring (BLOOD PRESSURE KIT) DEVI 1 Device by Does not apply route as needed. 1 each 0   ibuprofen (ADVIL) 600 MG tablet Take 1 tablet (600 mg total) by mouth every 6 (six) hours. 30 tablet 0   NIFEdipine (ADALAT  CC) 30 MG 24 hr tablet TAKE 1 TABLET BY MOUTH EVERY DAY (Patient not taking: Reported on 02/20/2021) 90 tablet 1   Prenatal Vit-Fe Fumarate-FA (PRENATAL MULTIVITAMIN) TABS tablet Take 1 tablet by mouth daily at 12 noon.     Allergies  Allergen Reactions   Venofer [Iron Sucrose] Hives, Itching, Nausea And Vomiting, Swelling and Other (See Comments)    Chills, swelling of both hands and feet, low back pain, chest tightness on inhalation    I have reviewed patient's Past Medical Hx, Surgical Hx, Family Hx, Social Hx, medications and allergies.   ROS:  Review of Systems  Constitutional:  Negative for fever.  Gastrointestinal:   Positive for abdominal pain. Negative for constipation and diarrhea.  Genitourinary:  Negative for dysuria and frequency.   Review of Systems  Other systems negative   Physical Exam  Physical Exam Patient Vitals for the past 24 hrs:  BP Temp Temp src Pulse Resp SpO2 Height Weight  05/20/22 0117 121/82 98.2 F (36.8 C) Oral 84 16 -- _0  (1.549 m) 45.7 kg  05/20/22 0014 (!) 139/103 98.7 F (37.1 C) Oral 94 16 100 % -- --   Constitutional: Well-developed, well-nourished female in no acute distress.  Cardiovascular: normal rate Respiratory: normal effort GI: Abd soft, non-tender.  MS: Extremities nontender, no edema, normal ROM Neurologic: Alert and oriented x 4.  GU: Neg CVAT.  PELVIC EXAM: deferred in lieu of transvaginal ultrasound  LAB RESULTS Results for orders placed or performed during the hospital encounter of 05/20/22 (from the past 24 hour(s))  CBC with Differential/Platelet     Status: Abnormal   Collection Time: 05/20/22 12:27 AM  Result Value Ref Range   WBC 5.8 4.0 - 10.5 K/uL   RBC 4.00 3.87 - 5.11 MIL/uL   Hemoglobin 12.1 12.0 - 15.0 g/dL   HCT 35.8 (L) 36.0 - 46.0 %   MCV 89.5 80.0 - 100.0 fL   MCH 30.3 26.0 - 34.0 pg   MCHC 33.8 30.0 - 36.0 g/dL   RDW 12.6 11.5 - 15.5 %   Platelets 245 150 - 400 K/uL   nRBC 0.0 0.0 - 0.2 %   Neutrophils Relative % 55 %   Neutro Abs 3.2 1.7 - 7.7 K/uL   Lymphocytes Relative 36 %   Lymphs Abs 2.1 0.7 - 4.0 K/uL   Monocytes Relative 7 %   Monocytes Absolute 0.4 0.1 - 1.0 K/uL   Eosinophils Relative 2 %   Eosinophils Absolute 0.1 0.0 - 0.5 K/uL   Basophils Relative 0 %   Basophils Absolute 0.0 0.0 - 0.1 K/uL   Immature Granulocytes 0 %   Abs Immature Granulocytes 0.02 0.00 - 0.07 K/uL  Comprehensive metabolic panel     Status: Abnormal   Collection Time: 05/20/22 12:27 AM  Result Value Ref Range   Sodium 138 135 - 145 mmol/L   Potassium 3.5 3.5 - 5.1 mmol/L   Chloride 108 98 - 111 mmol/L   CO2 20 (L) 22 - 32  mmol/L   Glucose, Bld 94 70 - 99 mg/dL   BUN 11 6 - 20 mg/dL   Creatinine, Ser 0.65 0.44 - 1.00 mg/dL   Calcium 9.7 8.9 - 10.3 mg/dL   Total Protein 7.0 6.5 - 8.1 g/dL   Albumin 4.1 3.5 - 5.0 g/dL   AST 16 15 - 41 U/L   ALT 10 0 - 44 U/L   Alkaline Phosphatase 47 38 - 126 U/L   Total Bilirubin 0.8  0.3 - 1.2 mg/dL   GFR, Estimated >60 >60 mL/min   Anion gap 10 5 - 15  Lipase, blood     Status: None   Collection Time: 05/20/22 12:27 AM  Result Value Ref Range   Lipase 28 11 - 51 U/L  I-Stat beta hCG blood, ED ([facility], [facility], [facility] only)     Status: Abnormal   Collection Time: 05/20/22 12:34 AM  Result Value Ref Range   I-stat hCG, quantitative >2,000.0 (H) <5 mIU/mL   Comment 3          Urinalysis, Routine w reflex microscopic     Status: Abnormal   Collection Time: 05/20/22 12:38 AM  Result Value Ref Range   Color, Urine YELLOW YELLOW   APPearance CLOUDY (A) CLEAR   Specific Gravity, Urine 1.028 1.005 - 1.030   pH 5.0 5.0 - 8.0   Glucose, UA NEGATIVE NEGATIVE mg/dL   Hgb urine dipstick NEGATIVE NEGATIVE   Bilirubin Urine NEGATIVE NEGATIVE   Ketones, ur 80 (A) NEGATIVE mg/dL   Protein, ur NEGATIVE NEGATIVE mg/dL   Nitrite NEGATIVE NEGATIVE   Leukocytes,Ua SMALL (A) NEGATIVE   RBC / HPF 0-5 0 - 5 RBC/hpf   WBC, UA 6-10 0 - 5 WBC/hpf   Bacteria, UA NONE SEEN NONE SEEN   Squamous Epithelial / LPF 21-50 0 - 5   Mucus PRESENT   Wet prep, genital     Status: None   Collection Time: 05/20/22  1:27 AM  Result Value Ref Range   Yeast Wet Prep HPF POC NONE SEEN NONE SEEN   Trich, Wet Prep NONE SEEN NONE SEEN   Clue Cells Wet Prep HPF POC NONE SEEN NONE SEEN   WBC, Wet Prep HPF POC <10 <10   Sperm NONE SEEN        IMAGING US OB LESS THAN 14 WEEKS WITH OB TRANSVAGINAL  Result Date: 05/20/2022 CLINICAL DATA:  Lower abdominal pain x5 days. EXAM: OBSTETRIC <14 WK [place] AND TRANSVAGINAL OB US TECHNIQUE: Both transabdominal and transvaginal ultrasound examinations were performed for complete  evaluation of the gestation as well as the maternal uterus, adnexal regions, and pelvic cul-de-sac. Transvaginal technique was performed to assess early pregnancy. COMPARISON:  None Available. FINDINGS: Intrauterine gestational sac: Single Yolk sac:  Visualized. Embryo:  Visualized. Cardiac Activity: Visualized. Heart Rate: 115 bpm CRL:  5.8 mm   6 w   3 d                  [place] EDC: January 10, 2023 Subchorionic hemorrhage:  None visualized. Maternal uterus/adnexae: The right ovary measures 3.1 cm x 1.2 cm x 1.8 cm and is normal in appearance. The left ovary measures 3.7 cm x 2.4 cm x 2.8 cm and is normal in appearance. No pelvic free fluid is identified. IMPRESSION: Single, viable intrauterine pregnancy at approximately 6 weeks and 3 days gestation by ultrasound evaluation. Electronically Signed   By: Virgina Norfolk M.D.   On: 05/20/2022 02:24     MAU Management/MDM: I have reviewed the triage vital signs and the nursing notes.   Pertinent labs & imaging results that were available during my care of the patient were reviewed by me and considered in my medical decision making (see chart for details).      I have reviewed her medical records including past results, notes and treatments.   Ordered usual first trimester r/o ectopic labs.   Pelvic cultures done Will check baseline Ultrasound to rule out ectopic.  This bleeding/pain can represent a normal pregnancy with bleeding, spontaneous abortion or even an ectopic which can be life-threatening.  The process as listed above helps to determine which of these is present.  Reviewed findings and results.  Discussed warning signs to report.  Urine does not show overt infection or hematuria, so doubt renal source.  Since pain moves, unclear what source is,  But [place] rules out ectopic pregnancy Will send in Rx for Zofran per request for nausea.  Warned of constipation side effect  ASSESSMENT Single IUP at 26w3dIntermittent lower abdominal pain Pregnancy of  unknown location, now confirmed IUP Nausea  PLAN Discharge home Encouraged to call office for new ob Rx Zofran for nausea  Pt stable at time of discharge. Encouraged to return here if she develops worsening of symptoms, increase in pain, fever, or other concerning symptoms.    MHansel FeinsteinCNM, MSN Certified Nurse-Midwife 05/20/2022  2:13 AM

## 2022-05-20 NOTE — ED Notes (Signed)
Report given to MAU RN on patient's transfer.  

## 2022-05-20 NOTE — ED Provider Triage Note (Signed)
Emergency Medicine Provider Triage Evaluation Note  Sydney Rice , a 26 y.o. female  was evaluated in triage.  Pt complains of lower abdominal pain that has been going on for about 5 days however constant for the past 3 days with associated nausea but no vomiting.  Denies fever, dysuria, vaginal bleeding.  Does endorse vaginal discharge now for about a month.  She states she normally has vaginal discharge however has increased in the amount otherwise it is no different.  Denies recent change in sexual partner.   Review of Systems  Positive: As above Negative: As above  Physical Exam  BP (!) 139/103 (BP Location: Right Arm)   Pulse 94   Temp 98.7 F (37.1 C) (Oral)   Resp 16   LMP 03/25/2022   SpO2 100%  Gen:   Awake, no distress   Resp:  Normal effort  MSK:   Moves extremities without difficulty  Other:  Left CVA tenderness.  Generalized abdominal tenderness to palpation.  Abdomen nondistended and soft.  Medical Decision Making  Medically screening exam initiated at 12:18 AM.  Appropriate orders placed.  Truitt Leep was informed that the remainder of the evaluation will be completed by another provider, this initial triage assessment does not replace that evaluation, and the importance of remaining in the ED until their evaluation is complete.     Marita Kansas, PA-C 05/20/22 [phone]

## 2022-05-21 LAB — CULTURE, OB URINE: Culture: 30000 — AB

## 2022-05-22 ENCOUNTER — Other Ambulatory Visit (INDEPENDENT_AMBULATORY_CARE_PROVIDER_SITE_OTHER): Payer: Self-pay | Admitting: Obstetrics and Gynecology

## 2022-05-22 ENCOUNTER — Encounter: Payer: Self-pay | Admitting: Advanced Practice Midwife

## 2022-05-22 DIAGNOSIS — R8271 Bacteriuria: Secondary | ICD-10-CM | POA: Insufficient documentation

## 2022-05-22 DIAGNOSIS — Z5321 Procedure and treatment not carried out due to patient leaving prior to being seen by health care provider: Secondary | ICD-10-CM

## 2022-05-22 HISTORY — DX: Bacteriuria: R82.71

## 2022-05-22 MED ORDER — AMOXICILLIN 500 MG PO CAPS: 500.0000 mg | ORAL_CAPSULE | Freq: Three times a day (TID) | ORAL | 0 refills | Status: AC

## 2022-05-22 NOTE — Progress Notes (Signed)
Notified of (+) GBS bacteriuria and Rx for Amoxicillin 500 mg TID x 7 days. Plans to establish [facility] with [facility] and plans to call for an appt after Thanksgiving. Advised to call the office Monday to get on the schedule d/t how fast appts fill up in our offices. Patient verbalized an understanding of the plan of care and agrees.   Raelyn Mora, CNM

## 2022-07-13 NOTE — L&D Delivery Note (Signed)
OB/GYN Faculty Practice Delivery Note  Sydney Rice is a 27 y.o. G2P1001 s/p SVD at [redacted]w[redacted]d. She was admitted for IOL for IUGR and gHTN .   ROM: 6h 40m with clear fluid GBS Status: Positive/-- (11/08 0000) Maximum Maternal Temperature: Temp (24hrs), Avg:98.2 F (36.8 C), Min:98.1 F (36.7 C), Max:98.2 F (36.8 C)    Labor Progress: Patient arrived at 3 cm dilation and was induced with pitocin, AROM .   Delivery Date/Time: 12/21/2022 at 0453 Delivery: Called to room and patient was complete and pushing. Head delivered in OA position. Nuchal cord present and immediately reduced, compound arm . Shoulder and body delivered in usual fashion. Infant with spontaneous cry, placed on mother's abdomen, dried and stimulated. Cord clamped x 2 after 1-minute delay, and cut by FOB. Cord blood drawn. Placenta delivered spontaneously with gentle cord traction. Fundus firm with massage and Pitocin. Labia, perineum, vagina, and cervix inspected with no lacerations .   Placenta:  spontaneous, intact, 3 vessel cord  Complications: None Lacerations: None EBL: 43 mL Analgesia: Epidural    Infant: APGAR (1 MIN): 9   APGAR (5 MINS): 9   APGAR (10 MINS):    Weight: pending   Derrel Nip, MD  OB Fellow  12/21/2022 5:13 AM

## 2022-08-05 ENCOUNTER — Telehealth: Payer: Self-pay | Admitting: Family Medicine

## 2022-08-05 ENCOUNTER — Telehealth: Payer: Medicaid Other

## 2022-08-05 NOTE — Telephone Encounter (Signed)
Called patient to cancel intake due to no staffing, there was no answer to the phone call so a voicemail was left with the call back number for the office.

## 2022-08-12 ENCOUNTER — Encounter: Payer: Self-pay | Admitting: Obstetrics and Gynecology

## 2022-08-12 ENCOUNTER — Other Ambulatory Visit: Payer: Self-pay

## 2022-08-12 ENCOUNTER — Ambulatory Visit (INDEPENDENT_AMBULATORY_CARE_PROVIDER_SITE_OTHER): Payer: Medicaid Other | Admitting: Obstetrics and Gynecology

## 2022-08-12 VITALS — BP 108/75 | HR 83 | Wt 107.5 lb

## 2022-08-12 DIAGNOSIS — O219 Vomiting of pregnancy, unspecified: Secondary | ICD-10-CM

## 2022-08-12 DIAGNOSIS — O09299 Supervision of pregnancy with other poor reproductive or obstetric history, unspecified trimester: Secondary | ICD-10-CM | POA: Insufficient documentation

## 2022-08-12 DIAGNOSIS — O0932 Supervision of pregnancy with insufficient antenatal care, second trimester: Secondary | ICD-10-CM

## 2022-08-12 DIAGNOSIS — R8271 Bacteriuria: Secondary | ICD-10-CM | POA: Diagnosis not present

## 2022-08-12 DIAGNOSIS — O0930 Supervision of pregnancy with insufficient antenatal care, unspecified trimester: Secondary | ICD-10-CM

## 2022-08-12 DIAGNOSIS — O0990 Supervision of high risk pregnancy, unspecified, unspecified trimester: Secondary | ICD-10-CM | POA: Insufficient documentation

## 2022-08-12 DIAGNOSIS — Z8759 Personal history of other complications of pregnancy, childbirth and the puerperium: Secondary | ICD-10-CM | POA: Diagnosis not present

## 2022-08-12 DIAGNOSIS — Z3490 Encounter for supervision of normal pregnancy, unspecified, unspecified trimester: Secondary | ICD-10-CM

## 2022-08-12 DIAGNOSIS — Z3A18 18 weeks gestation of pregnancy: Secondary | ICD-10-CM

## 2022-08-12 MED ORDER — ONDANSETRON 4 MG PO TBDP: 4.0000 mg | ORAL_TABLET | Freq: Four times a day (QID) | ORAL | 0 refills | Status: DC | PRN

## 2022-08-12 MED ORDER — PRENATAL MULTIVITAMIN CH: 1.0000 | ORAL_TABLET | Freq: Every day | ORAL | 1 refills | Status: AC

## 2022-08-12 MED ORDER — ASPIRIN 81 MG PO TBEC: 81.0000 mg | DELAYED_RELEASE_TABLET | Freq: Every day | ORAL | 1 refills | Status: DC

## 2022-08-12 NOTE — Progress Notes (Signed)
New OB Note  08/12/2022   Clinic: Center for [facility]  Chief Complaint: new OB  Transfer of Care Patient: no  History of Present Illness: Sydney Rice is a 27 y.o. G2P1001 at 18/4 weeks ([place] 6/29, based on Patient's last menstrual period was 04/04/2022.=6wk u/s).  Preg complicated by has Anemia in pregnancy; Nausea/vomiting in pregnancy; GBS bacteriuria; Supervision of low-risk pregnancy; and History of gestational hypertension on their problem list.   No issue is occasional nausea sometimes helped with zofran odt.   ROS: A 12-point review of systems was performed and negative, except as stated in the above HPI.  OBGYN History: As per HPI. OB History  Gravida Para Term Preterm AB Living  2 1 1  0 0 1  SAB IAB Ectopic Multiple Live Births  0 0 0 0 1    # Outcome Date GA Lbr Len/2nd Weight Sex Delivery Anes PTL Lv  2 Current           1 Term 01/04/21 [redacted]w[redacted]d 09:18 / 00:43 7 lb 0.9 oz (3.201 kg) M Vag-Spont EPI  LIV    Prior children are healthy, doing well, and without any problems or issues: yes History of pap smears: Yes. Last pap smear 2022 and results were wnl   Past Medical History: Past Medical History:  Diagnosis Date   Gestational hypertension    Medical history non-contributory     Past Surgical History: Past Surgical History:  Procedure Laterality Date   NO PAST SURGERIES      Family History:  Family History  Problem Relation Age of Onset   Hypertension Mother     Social History:  Social History   Socioeconomic History   Marital status: Single    Spouse name: Not on file   Number of children: Not on file   Years of education: Not on file   Highest education level: Not on file  Occupational History   Occupation: Ship broker  Tobacco Use   Smoking status: Never   Smokeless tobacco: Never  Vaping Use   Vaping Use: Never used  Substance and Sexual Activity   Alcohol use: Not Currently   Drug use: Not Currently    Types:  Marijuana    Comment: last smoked 1 yr   Sexual activity: Yes    Birth control/protection: None, Condom  Other Topics Concern   Not on file  Social History Narrative   Not on file   Social Determinants of Health   Financial Resource Strain: Not on file  Food Insecurity: No Food Insecurity (12/26/2020)   Hunger Vital Sign    Worried About Running Out of Food in the Last Year: Never true    Ran Out of Food in the Last Year: Never true  Transportation Needs: No Transportation Needs (12/26/2020)   PRAPARE - Hydrologist (Medical): No    Lack of Transportation (Non-Medical): No  Physical Activity: Not on file  Stress: Not on file  Social Connections: Not on file  Intimate Partner Violence: Not on file    Allergy: Allergies  Allergen Reactions   Venofer [Iron Sucrose] Hives, Itching, Nausea And Vomiting, Swelling and Other (See Comments)    Chills, swelling of both hands and feet, low back pain, chest tightness on inhalation    Current Outpatient Medications: Prenatal  Physical Exam:   BP 108/75   Pulse 83   Wt 107 lb 8 oz (48.8 kg)   LMP 04/04/2022   BMI 20.31 kg/m  Body mass index is 20.31 kg/m.   Vag. Bleeding: None. Fundal height: not applicable FHTs: 222L  General appearance: Well nourished, well developed female in no acute distress.  Neck:  Supple, normal appearance, and no thyromegaly  Cardiovascular: S1, S2 normal, no murmur, rub or gallop, regular rate and rhythm Respiratory:  Clear to auscultation bilateral. Normal respiratory effort Abdomen: positive bowel sounds and no masses, hernias; diffusely non tender to palpation, non distended Neuro/Psych:  Normal mood and affect.  Skin:  Warm and dry.  Lymphatic:  No inguinal lymphadenopathy.   Pelvic exam: is not limited by body habitus EGBUS: within normal limits, Vagina: within normal limits and with no blood in the vault, Cervix: normal appearing cervix without discharge or  lesions, closed/long/high, Uterus:  enlarged, c/w 18 week size, and Adnexa:  normal adnexa  Laboratory: No new labs  Imaging:  No new imaging  Assessment: patient doing well  Plan: 1. Encounter for supervision of low-risk pregnancy, antepartum Routine care.  - AFP, Serum, Open Spina Bifida - CBC/D/Plt+RPR+Rh+ABO+RubIgG... - [place] MFM OB COMP + 14 WK; Future - Ferritin - Comprehensive metabolic panel - Protein / creatinine ratio, urine - Culture, OB Urine  2. History of gestational hypertension Pt amenable to starting low dose asa  3. GBS bacteriuria Treat in labor  4. Late prenatal care  5. Nausea/vomiting in pregnancy Weight good. Continue PRN zofran  Problem list reviewed and updated.  Follow up in 4 weeks.   >50% of 35 min visit spent on counseling and coordination of care.     Durene Romans MD Attending Center for [facility])

## 2022-08-13 ENCOUNTER — Encounter: Payer: Self-pay | Admitting: *Deleted

## 2022-08-14 LAB — AFP, SERUM, OPEN SPINA BIFIDA
AFP MoM: 0.84
AFP Value: 51.7 ng/mL
Gest. Age on Collection Date: 18 weeks
Maternal Age At EDD: 26 yr
OSBR Risk 1 IN: 10000
Test Results:: NEGATIVE
Weight: 107 [lb_av]

## 2022-08-14 LAB — CBC/D/PLT+RPR+RH+ABO+RUBIGG...
Antibody Screen: NEGATIVE
Basophils Absolute: 0 10*3/uL (ref 0.0–0.2)
Basos: 0 %
EOS (ABSOLUTE): 0.1 10*3/uL (ref 0.0–0.4)
Eos: 2 %
HCV Ab: NONREACTIVE
HIV Screen 4th Generation wRfx: NONREACTIVE
Hematocrit: 28.6 % — ABNORMAL LOW (ref 34.0–46.6)
Hemoglobin: 9.6 g/dL — ABNORMAL LOW (ref 11.1–15.9)
Hepatitis B Surface Ag: NEGATIVE
Immature Grans (Abs): 0 10*3/uL (ref 0.0–0.1)
Immature Granulocytes: 1 %
Lymphocytes Absolute: 1.3 10*3/uL (ref 0.7–3.1)
Lymphs: 22 %
MCH: 30.7 pg (ref 26.6–33.0)
MCHC: 33.6 g/dL (ref 31.5–35.7)
MCV: 91 fL (ref 79–97)
Monocytes Absolute: 0.3 10*3/uL (ref 0.1–0.9)
Monocytes: 5 %
Neutrophils Absolute: 4.1 10*3/uL (ref 1.4–7.0)
Neutrophils: 70 %
Platelets: 229 10*3/uL (ref 150–450)
RBC: 3.13 x10E6/uL — ABNORMAL LOW (ref 3.77–5.28)
RDW: 13.2 % (ref 11.7–15.4)
RPR Ser Ql: NONREACTIVE
Rh Factor: POSITIVE
Rubella Antibodies, IGG: 3.11 index (ref >0.99)
WBC: 5.9 10*3/uL (ref 3.4–10.8)

## 2022-08-14 LAB — COMPREHENSIVE METABOLIC PANEL
ALT: 10 IU/L (ref 0–32)
AST: 14 IU/L (ref 0–40)
Albumin/Globulin Ratio: 1.6 (ref 1.2–2.2)
Albumin: 3.9 g/dL — ABNORMAL LOW (ref 4.0–5.0)
Alkaline Phosphatase: 41 IU/L — ABNORMAL LOW (ref 44–121)
BUN/Creatinine Ratio: 17 (ref 9–23)
BUN: 9 mg/dL (ref 6–20)
Bilirubin Total: 0.3 mg/dL (ref 0.0–1.2)
CO2: 20 mmol/L (ref 20–29)
Calcium: 9.1 mg/dL (ref 8.7–10.2)
Chloride: 102 mmol/L (ref 96–106)
Creatinine, Ser: 0.53 mg/dL — ABNORMAL LOW (ref 0.57–1.00)
Globulin, Total: 2.5 g/dL (ref 1.5–4.5)
Glucose: 69 mg/dL — ABNORMAL LOW (ref 70–99)
Potassium: 4.1 mmol/L (ref 3.5–5.2)
Sodium: 135 mmol/L (ref 134–144)
Total Protein: 6.4 g/dL (ref 6.0–8.5)
eGFR: 132 mL/min/{1.73_m2} (ref >59)

## 2022-08-14 LAB — PROTEIN / CREATININE RATIO, URINE
Creatinine, Urine: 128.2 mg/dL
Protein, Ur: 10.2 mg/dL
Protein/Creat Ratio: 80 mg/g creat (ref 0–200)

## 2022-08-14 LAB — CULTURE, OB URINE

## 2022-08-14 LAB — URINE CULTURE, OB REFLEX

## 2022-08-14 LAB — HCV INTERPRETATION

## 2022-08-14 LAB — FERRITIN: Ferritin: 95 ng/mL (ref 15–150)

## 2022-08-20 ENCOUNTER — Encounter: Payer: Self-pay | Admitting: General Practice

## 2022-08-20 ENCOUNTER — Encounter: Payer: Self-pay | Admitting: Obstetrics and Gynecology

## 2022-08-20 DIAGNOSIS — Z3490 Encounter for supervision of normal pregnancy, unspecified, unspecified trimester: Secondary | ICD-10-CM

## 2022-08-20 NOTE — Progress Notes (Unsigned)
Panorama results reviewed and low risk female  Durene Romans MD Attending Center for [employer] (Faculty Practice) 08/20/2022 Time: [phone]

## 2022-09-03 ENCOUNTER — Other Ambulatory Visit: Payer: Self-pay | Admitting: *Deleted

## 2022-09-03 ENCOUNTER — Other Ambulatory Visit: Payer: Self-pay | Admitting: Obstetrics and Gynecology

## 2022-09-03 ENCOUNTER — Ambulatory Visit: Payer: Medicaid Other | Attending: Obstetrics and Gynecology

## 2022-09-03 ENCOUNTER — Encounter: Payer: Self-pay | Admitting: Obstetrics and Gynecology

## 2022-09-03 DIAGNOSIS — Z3A21 21 weeks gestation of pregnancy: Secondary | ICD-10-CM | POA: Insufficient documentation

## 2022-09-03 DIAGNOSIS — O365921 Maternal care for other known or suspected poor fetal growth, second trimester, fetus 1: Secondary | ICD-10-CM | POA: Diagnosis not present

## 2022-09-03 DIAGNOSIS — O99012 Anemia complicating pregnancy, second trimester: Secondary | ICD-10-CM | POA: Insufficient documentation

## 2022-09-03 DIAGNOSIS — Z3490 Encounter for supervision of normal pregnancy, unspecified, unspecified trimester: Secondary | ICD-10-CM

## 2022-09-03 DIAGNOSIS — O219 Vomiting of pregnancy, unspecified: Secondary | ICD-10-CM

## 2022-09-03 DIAGNOSIS — O0930 Supervision of pregnancy with insufficient antenatal care, unspecified trimester: Secondary | ICD-10-CM | POA: Diagnosis not present

## 2022-09-03 DIAGNOSIS — O0932 Supervision of pregnancy with insufficient antenatal care, second trimester: Secondary | ICD-10-CM | POA: Diagnosis not present

## 2022-09-03 DIAGNOSIS — O2612 Low weight gain in pregnancy, second trimester: Secondary | ICD-10-CM

## 2022-09-03 DIAGNOSIS — O365920 Maternal care for other known or suspected poor fetal growth, second trimester, not applicable or unspecified: Secondary | ICD-10-CM | POA: Diagnosis not present

## 2022-09-03 DIAGNOSIS — O1492 Unspecified pre-eclampsia, second trimester: Secondary | ICD-10-CM | POA: Insufficient documentation

## 2022-09-03 DIAGNOSIS — O09292 Supervision of pregnancy with other poor reproductive or obstetric history, second trimester: Secondary | ICD-10-CM | POA: Insufficient documentation

## 2022-09-03 DIAGNOSIS — R8271 Bacteriuria: Secondary | ICD-10-CM

## 2022-09-03 DIAGNOSIS — Z363 Encounter for antenatal screening for malformations: Secondary | ICD-10-CM | POA: Diagnosis not present

## 2022-09-03 DIAGNOSIS — O365990 Maternal care for other known or suspected poor fetal growth, unspecified trimester, not applicable or unspecified: Secondary | ICD-10-CM | POA: Insufficient documentation

## 2022-09-03 DIAGNOSIS — Z8759 Personal history of other complications of pregnancy, childbirth and the puerperium: Secondary | ICD-10-CM

## 2022-09-03 DIAGNOSIS — O09299 Supervision of pregnancy with other poor reproductive or obstetric history, unspecified trimester: Secondary | ICD-10-CM

## 2022-09-09 ENCOUNTER — Other Ambulatory Visit: Payer: Self-pay

## 2022-09-09 ENCOUNTER — Ambulatory Visit (INDEPENDENT_AMBULATORY_CARE_PROVIDER_SITE_OTHER): Payer: Medicaid Other | Admitting: Obstetrics and Gynecology

## 2022-09-09 VITALS — BP 112/75 | HR 80 | Wt 112.0 lb

## 2022-09-09 DIAGNOSIS — O365920 Maternal care for other known or suspected poor fetal growth, second trimester, not applicable or unspecified: Secondary | ICD-10-CM

## 2022-09-09 DIAGNOSIS — O99012 Anemia complicating pregnancy, second trimester: Secondary | ICD-10-CM

## 2022-09-09 DIAGNOSIS — R8271 Bacteriuria: Secondary | ICD-10-CM

## 2022-09-09 DIAGNOSIS — Z8759 Personal history of other complications of pregnancy, childbirth and the puerperium: Secondary | ICD-10-CM

## 2022-09-09 DIAGNOSIS — Z3A22 22 weeks gestation of pregnancy: Secondary | ICD-10-CM

## 2022-09-09 DIAGNOSIS — O365930 Maternal care for other known or suspected poor fetal growth, third trimester, not applicable or unspecified: Secondary | ICD-10-CM

## 2022-09-09 NOTE — Progress Notes (Signed)
   PRENATAL VISIT NOTE  Subjective:  Sydney Rice is a 27 y.o. G2P1001 at 1w4dbeing seen today for ongoing prenatal care.  She is currently monitored for the following issues for this high-risk pregnancy and has Anemia in pregnancy; Nausea/vomiting in pregnancy; GBS bacteriuria; Supervision of low-risk pregnancy; History of gestational hypertension; and IUGR (intrauterine growth restriction) affecting care of mother on their problem list.  Patient reports  some vaginitis and discharge after switching laundry detergents .  Contractions: Not present. Vag. Bleeding: None.  Movement: Present. Denies leaking of fluid.   The following portions of the patient's history were reviewed and updated as appropriate: allergies, current medications, past family history, past medical history, past social history, past surgical history and problem list.   Objective:   Vitals:   09/09/22 1125  BP: 112/75  Pulse: 80  Weight: 112 lb (50.8 kg)    Fetal Status: Fetal Heart Rate (bpm): 148   Movement: Present     General:  Alert, oriented and cooperative. Patient is in no acute distress.  Skin: Skin is warm and dry. No rash noted.   Cardiovascular: Normal heart rate noted  Respiratory: Normal respiratory effort, no problems with respiration noted  Abdomen: Soft, gravid, appropriate for gestational age.  Pain/Pressure: Absent     Pelvic: Cervical exam deferred        Extremities: Normal range of motion.  Edema: None  Mental Status: Normal mood and affect. Normal behavior. Normal judgment and thought content.   Assessment and Plan:  Pregnancy: G2P1001 at 253w4d. Anemia during pregnancy in second trimester Recheck ferritin with GTT  2. GBS bacteriuria Tx in labor  3. History of gestational hypertension Confirms on low dose asa. No current   4. Poor fetal growth affecting management of mother in third trimester, single or unspecified fetus 2/22: 9%, 376g, ac 14%, afi wnl, UA wnl Follow up  repeat ultrasound with mfm. [facility] precautions. Good weight gain this pregnancy (17lbs)  Preterm labor symptoms and general obstetric precautions including but not limited to vaginal bleeding, contractions, leaking of fluid and fetal movement were reviewed in detail with the patient. Please refer to After Visit Summary for other counseling recommendations.   Return in about 22 days (around 10/01/2022) for in person, md visit, high risk ob.  Future Appointments  Date Time Provider [place]3/21/2024 12:30 [facility]/2024 12:45 PM [facility] US5 [facility]    ChAletha HalimMD

## 2022-09-14 ENCOUNTER — Encounter: Payer: Self-pay | Admitting: Family Medicine

## 2022-10-01 ENCOUNTER — Other Ambulatory Visit: Payer: Self-pay

## 2022-10-01 ENCOUNTER — Ambulatory Visit: Payer: Medicaid Other | Attending: Obstetrics and Gynecology

## 2022-10-01 ENCOUNTER — Encounter: Payer: Self-pay | Admitting: Family Medicine

## 2022-10-01 ENCOUNTER — Ambulatory Visit (INDEPENDENT_AMBULATORY_CARE_PROVIDER_SITE_OTHER): Payer: Medicaid Other | Admitting: Family Medicine

## 2022-10-01 ENCOUNTER — Ambulatory Visit: Payer: Medicaid Other | Attending: Obstetrics | Admitting: Obstetrics

## 2022-10-01 ENCOUNTER — Ambulatory Visit: Payer: Medicaid Other | Admitting: *Deleted

## 2022-10-01 ENCOUNTER — Other Ambulatory Visit: Payer: Self-pay | Admitting: Obstetrics and Gynecology

## 2022-10-01 ENCOUNTER — Other Ambulatory Visit: Payer: Self-pay | Admitting: *Deleted

## 2022-10-01 VITALS — BP 126/74 | HR 102 | Wt 114.4 lb

## 2022-10-01 VITALS — BP 111/51 | HR 83

## 2022-10-01 DIAGNOSIS — O99012 Anemia complicating pregnancy, second trimester: Secondary | ICD-10-CM | POA: Insufficient documentation

## 2022-10-01 DIAGNOSIS — Z3490 Encounter for supervision of normal pregnancy, unspecified, unspecified trimester: Secondary | ICD-10-CM

## 2022-10-01 DIAGNOSIS — O365920 Maternal care for other known or suspected poor fetal growth, second trimester, not applicable or unspecified: Secondary | ICD-10-CM | POA: Insufficient documentation

## 2022-10-01 DIAGNOSIS — Z3A25 25 weeks gestation of pregnancy: Secondary | ICD-10-CM

## 2022-10-01 DIAGNOSIS — R8271 Bacteriuria: Secondary | ICD-10-CM

## 2022-10-01 DIAGNOSIS — Z8759 Personal history of other complications of pregnancy, childbirth and the puerperium: Secondary | ICD-10-CM

## 2022-10-01 DIAGNOSIS — O09292 Supervision of pregnancy with other poor reproductive or obstetric history, second trimester: Secondary | ICD-10-CM

## 2022-10-01 DIAGNOSIS — O2612 Low weight gain in pregnancy, second trimester: Secondary | ICD-10-CM | POA: Diagnosis present

## 2022-10-01 DIAGNOSIS — O219 Vomiting of pregnancy, unspecified: Secondary | ICD-10-CM | POA: Diagnosis not present

## 2022-10-01 DIAGNOSIS — O0932 Supervision of pregnancy with insufficient antenatal care, second trimester: Secondary | ICD-10-CM | POA: Insufficient documentation

## 2022-10-01 DIAGNOSIS — O09299 Supervision of pregnancy with other poor reproductive or obstetric history, unspecified trimester: Secondary | ICD-10-CM | POA: Diagnosis present

## 2022-10-01 DIAGNOSIS — O365921 Maternal care for other known or suspected poor fetal growth, second trimester, fetus 1: Secondary | ICD-10-CM

## 2022-10-01 DIAGNOSIS — O1492 Unspecified pre-eclampsia, second trimester: Secondary | ICD-10-CM | POA: Diagnosis not present

## 2022-10-01 DIAGNOSIS — D649 Anemia, unspecified: Secondary | ICD-10-CM

## 2022-10-01 DIAGNOSIS — Z3492 Encounter for supervision of normal pregnancy, unspecified, second trimester: Secondary | ICD-10-CM

## 2022-10-01 DIAGNOSIS — O365930 Maternal care for other known or suspected poor fetal growth, third trimester, not applicable or unspecified: Secondary | ICD-10-CM

## 2022-10-01 NOTE — Progress Notes (Signed)
MFM Note  Sydney Rice was seen due to a borderline IUGR fetus noted at her last exam.  She denies any problems since her last exam and reports feeling vigorous fetal movements throughout the day.    On today's exam, the EFW 1 pound 7 ounces measures at the 3rd percentile for her gestational age indicating IUGR.    There was normal amniotic fluid noted on today's exam.    Doppler studies of the umbilical arteries showed a normal S/D ratio of 3.95 .  There were no signs of absent or reversed end-diastolic flow.    The increased risk of an adverse pregnancy outcomes such as an IUFD associated with IUGR was discussed.  The patient was reassured that as her prepregnancy BMI was only 18, the baby is most likely constitutionally small and the overall risk of an adverse outcome is low.  Due to fetal growth restriction, she will return in 2 weeks for an umbilical artery Doppler study. We will reassess the fetal growth again in 3 weeks.    Should IUGR continue to be noted later in her pregnancy, delivery may be recommended at between 37 to 38 weeks.  The patient stated that all of her questions were answered today.  A total of 20 minutes was spent counseling and coordinating the care for this patient.  Greater than 50% of the time was spent in direct face-to-face contact.

## 2022-10-01 NOTE — Patient Instructions (Addendum)
Safe Medications in Pregnancy   Acne:  Benzoyl Peroxide  Salicylic Acid   Backache/Headache:  Tylenol: 2 regular strength every 4 hours OR               2 Extra strength every 6 hours   Colds/Coughs/Allergies:  Benadryl (alcohol free) 25 mg every 6 hours as needed  Breath right strips  Claritin  Cepacol throat lozenges  Chloraseptic throat spray  Cold-Eeze- up to three times per day  Cough drops, alcohol free  Flonase (by prescription only)  Guaifenesin  Mucinex  Robitussin DM (plain only, alcohol free)  Saline nasal spray/drops  Sudafed (pseudoephedrine) & Actifed * use only after [redacted] weeks gestation and if you do not have high blood pressure  Tylenol  Vicks Vaporub  Zinc lozenges  Zyrtec   Constipation:  Colace  Ducolax suppositories  Fleet enema  Glycerin suppositories  Metamucil  Milk of magnesia  Miralax  Senokot  Smooth move tea   Diarrhea:  Kaopectate  Imodium A-D   *NO pepto Bismol   Hemorrhoids:  Anusol  Anusol HC  Preparation H  Tucks   Indigestion:  Tums  Maalox  Mylanta  Zantac  Pepcid   Insomnia:  Benadryl (alcohol free) 25mg every 6 hours as needed  Tylenol PM  Unisom, no Gelcaps   Leg Cramps:  Tums  MagGel   Nausea/Vomiting:  Bonine  Dramamine  Emetrol  Ginger extract  Sea bands  Meclizine  Nausea medication to take during pregnancy:  Unisom (doxylamine succinate 25 mg tablets) Take one tablet daily at bedtime. If symptoms are not adequately controlled, the dose can be increased to a maximum recommended dose of two tablets daily (1/2 tablet in the morning, 1/2 tablet mid-afternoon and one at bedtime).  Vitamin B6 100mg tablets. Take one tablet twice a day (up to 200 mg per day).   Skin Rashes:  Aveeno products  Benadryl cream or 25mg every 6 hours as needed  Calamine Lotion  1% cortisone cream   Yeast infection:  Gyne-lotrimin 7  Monistat 7    **If taking multiple medications, please check labels to avoid  duplicating the same active ingredients  **take medication as directed on the label  ** Do not exceed 4000 mg of tylenol in 24 hours  **Do not take medications that contain aspirin or ibuprofen           Contraception Choices Contraception, also called birth control, refers to methods or devices that prevent pregnancy. Hormonal methods  Contraceptive implant A contraceptive implant is a thin, plastic tube that contains a hormone that prevents pregnancy. It is different from an intrauterine device (IUD). It is inserted into the upper part of the arm by a health care provider. Implants can be effective for up to 3 years. Progestin-only injections Progestin-only injections are injections of progestin, a synthetic form of the hormone progesterone. They are given every 3 months by a health care provider. Birth control pills Birth control pills are pills that contain hormones that prevent pregnancy. They must be taken once a day, preferably at the same time each day. A prescription is needed to use this method of contraception. Birth control patch The birth control patch contains hormones that prevent pregnancy. It is placed on the skin and must be changed once a week for three weeks and removed on the fourth week. A prescription is needed to use this method of contraception. Vaginal ring A vaginal ring contains hormones that prevent pregnancy. It is placed in the   vagina for three weeks and removed on the fourth week. After that, the process is repeated with a new ring. A prescription is needed to use this method of contraception. Emergency contraceptive Emergency contraceptives prevent pregnancy after unprotected sex. They come in pill form and can be taken up to 5 days after sex. They work best the sooner they are taken after having sex. Most emergency contraceptives are available without a prescription. This method should not be used as your only form of birth control. Barrier methods  Female  condom A female condom is a thin sheath that is worn over the penis during sex. Condoms keep sperm from going inside a woman's body. They can be used with a sperm-killing substance (spermicide) to increase their effectiveness. They should be thrown away after one use. Female condom A female condom is a soft, loose-fitting sheath that is put into the vagina before sex. The condom keeps sperm from going inside a woman's body. They should be thrown away after one use. Diaphragm A diaphragm is a soft, dome-shaped barrier. It is inserted into the vagina before sex, along with a spermicide. The diaphragm blocks sperm from entering the uterus, and the spermicide kills sperm. A diaphragm should be left in the vagina for 6-8 hours after sex and removed within 24 hours. A diaphragm is prescribed and fitted by a health care provider. A diaphragm should be replaced every 1-2 years, after giving birth, after gaining more than 15 lb (6.8 kg), and after pelvic surgery. Cervical cap A cervical cap is a round, soft latex or plastic cup that fits over the cervix. It is inserted into the vagina before sex, along with spermicide. It blocks sperm from entering the uterus. The cap should be left in place for 6-8 hours after sex and removed within 48 hours. A cervical cap must be prescribed and fitted by a health care provider. It should be replaced every 2 years. Sponge A sponge is a soft, circular piece of polyurethane foam with spermicide in it. The sponge helps block sperm from entering the uterus, and the spermicide kills sperm. To use it, you make it wet and then insert it into the vagina. It should be inserted before sex, left in for at least 6 hours after sex, and removed and thrown away within 30 hours. Spermicides Spermicides are chemicals that kill or block sperm from entering the cervix and uterus. They can come as a cream, jelly, suppository, foam, or tablet. A spermicide should be inserted into the vagina with an  applicator at least 10-15 minutes before sex to allow time for it to work. The process must be repeated every time you have sex. Spermicides do not require a prescription. Intrauterine contraception Intrauterine device (IUD) An IUD is a T-shaped device that is put in a woman's uterus. There are two types: Hormone IUD.This type contains progestin, a synthetic form of the hormone progesterone. This type can stay in place for 3-5 years. Copper IUD.This type is wrapped in copper wire. It can stay in place for 10 years. Permanent methods of contraception Female tubal ligation In this method, a woman's fallopian tubes are sealed, tied, or blocked during surgery to prevent eggs from traveling to the uterus. Hysteroscopic sterilization In this method, a small, flexible insert is placed into each fallopian tube. The inserts cause scar tissue to form in the fallopian tubes and block them, so sperm cannot reach an egg. The procedure takes about 3 months to be effective. Another form of birth   control must be used during those 3 months. Female sterilization This is a procedure to tie off the tubes that carry sperm (vasectomy). After the procedure, the man can still ejaculate fluid (semen). Another form of birth control must be used for 3 months after the procedure. Natural planning methods Natural family planning In this method, a couple does not have sex on days when the woman could become pregnant. Calendar method In this method, the woman keeps track of the length of each menstrual cycle, identifies the days when pregnancy can happen, and does not have sex on those days. Ovulation method In this method, a couple avoids sex during ovulation. Symptothermal method This method involves not having sex during ovulation. The woman typically checks for ovulation by watching changes in her temperature and in the consistency of cervical mucus. Post-ovulation method In this method, a couple waits to have sex until  after ovulation. Where to find more information Centers for Disease Control and Prevention: www.cdc.gov Summary Contraception, also called birth control, refers to methods or devices that prevent pregnancy. Hormonal methods of contraception include implants, injections, pills, patches, vaginal rings, and emergency contraceptives. Barrier methods of contraception can include female condoms, female condoms, diaphragms, cervical caps, sponges, and spermicides. There are two types of IUDs (intrauterine devices). An IUD can be put in a woman's uterus to prevent pregnancy for 3-5 years. Permanent sterilization can be done through a procedure for males and females. Natural family planning methods involve nothaving sex on days when the woman could become pregnant. This information is not intended to replace advice given to you by your health care provider. Make sure you discuss any questions you have with your health care provider. Document Revised: 12/04/2019 Document Reviewed: 12/04/2019 Elsevier Patient Education  2023 Elsevier Inc.  

## 2022-10-01 NOTE — Progress Notes (Signed)
   Subjective:  Sydney Rice is a 27 y.o. G2P1001 at [redacted]w[redacted]d being seen today for ongoing prenatal care.  She is currently monitored for the following issues for this high-risk pregnancy and has Anemia in pregnancy; Nausea/vomiting in pregnancy; GBS bacteriuria; Supervision of low-risk pregnancy; History of gestational hypertension; and IUGR (intrauterine growth restriction) affecting care of mother on their problem list.  Patient reports no complaints.  Contractions: Not present. Vag. Bleeding: None.  Movement: Present. Denies leaking of fluid.   The following portions of the patient's history were reviewed and updated as appropriate: allergies, current medications, past family history, past medical history, past social history, past surgical history and problem list. Problem list updated.  Objective:   Vitals:   10/01/22 1500  BP: 126/74  Pulse: (!) 102  Weight: 114 lb 6.4 oz (51.9 kg)    Fetal Status: Fetal Heart Rate (bpm): 148   Movement: Present     General:  Alert, oriented and cooperative. Patient is in no acute distress.  Skin: Skin is warm and dry. No rash noted.   Cardiovascular: Normal heart rate noted  Respiratory: Normal respiratory effort, no problems with respiration noted  Abdomen: Soft, gravid, appropriate for gestational age. Pain/Pressure: Present     Pelvic: Vag. Bleeding: None     Cervical exam deferred        Extremities: Normal range of motion.     Mental Status: Normal mood and affect. Normal behavior. Normal judgment and thought content.   Urinalysis:      Assessment and Plan:  Pregnancy: G2P1001 at [redacted]w[redacted]d  1. GBS bacteriuria Ppx in labor  2. Encounter for supervision of low-risk pregnancy, antepartum BP and FHR normal Advised of fasting labs for next visit Considering post placental copper IUD, will let us know  3. Poor fetal growth affecting management of mother in third trimester, single or unspecified fetus Last growth US done today  EFW  2.6%, AC 2.3%, AFI wnl, normal cord dopplers Following w MFM  4. History of gestational hypertension On ASA  5. Anemia during pregnancy in second trimester Lab Results  Component Value Date   HGB 9.6 (L) 08/12/2022  Taking iron gummies  Preterm labor symptoms and general obstetric precautions including but not limited to vaginal bleeding, contractions, leaking of fluid and fetal movement were reviewed in detail with the patient. Please refer to After Visit Summary for other counseling recommendations.  Return in 2 weeks (on 10/15/2022) for [facility], ob visit.   Clarnce Flock, MD

## 2022-10-07 ENCOUNTER — Encounter: Payer: Self-pay | Admitting: *Deleted

## 2022-10-14 ENCOUNTER — Ambulatory Visit: Payer: Medicaid Other | Admitting: *Deleted

## 2022-10-14 ENCOUNTER — Other Ambulatory Visit: Payer: Self-pay | Admitting: *Deleted

## 2022-10-14 ENCOUNTER — Ambulatory Visit: Payer: Medicaid Other | Attending: Obstetrics

## 2022-10-14 VITALS — BP 114/55 | HR 72

## 2022-10-14 DIAGNOSIS — O0932 Supervision of pregnancy with insufficient antenatal care, second trimester: Secondary | ICD-10-CM | POA: Diagnosis not present

## 2022-10-14 DIAGNOSIS — D649 Anemia, unspecified: Secondary | ICD-10-CM

## 2022-10-14 DIAGNOSIS — O365990 Maternal care for other known or suspected poor fetal growth, unspecified trimester, not applicable or unspecified: Secondary | ICD-10-CM

## 2022-10-14 DIAGNOSIS — O365920 Maternal care for other known or suspected poor fetal growth, second trimester, not applicable or unspecified: Secondary | ICD-10-CM | POA: Insufficient documentation

## 2022-10-14 DIAGNOSIS — O99012 Anemia complicating pregnancy, second trimester: Secondary | ICD-10-CM | POA: Diagnosis not present

## 2022-10-14 DIAGNOSIS — O09292 Supervision of pregnancy with other poor reproductive or obstetric history, second trimester: Secondary | ICD-10-CM | POA: Diagnosis not present

## 2022-10-14 DIAGNOSIS — O365921 Maternal care for other known or suspected poor fetal growth, second trimester, fetus 1: Secondary | ICD-10-CM | POA: Diagnosis not present

## 2022-10-14 DIAGNOSIS — Z3A27 27 weeks gestation of pregnancy: Secondary | ICD-10-CM

## 2022-10-16 ENCOUNTER — Ambulatory Visit (INDEPENDENT_AMBULATORY_CARE_PROVIDER_SITE_OTHER): Payer: Medicaid Other | Admitting: Advanced Practice Midwife

## 2022-10-16 ENCOUNTER — Other Ambulatory Visit: Payer: Self-pay

## 2022-10-16 ENCOUNTER — Encounter: Payer: Self-pay | Admitting: Advanced Practice Midwife

## 2022-10-16 VITALS — BP 119/81 | HR 85 | Wt 118.5 lb

## 2022-10-16 DIAGNOSIS — Z23 Encounter for immunization: Secondary | ICD-10-CM | POA: Diagnosis not present

## 2022-10-16 DIAGNOSIS — O99012 Anemia complicating pregnancy, second trimester: Secondary | ICD-10-CM

## 2022-10-16 DIAGNOSIS — O365920 Maternal care for other known or suspected poor fetal growth, second trimester, not applicable or unspecified: Secondary | ICD-10-CM

## 2022-10-16 DIAGNOSIS — Z3490 Encounter for supervision of normal pregnancy, unspecified, unspecified trimester: Secondary | ICD-10-CM

## 2022-10-16 DIAGNOSIS — Z3A27 27 weeks gestation of pregnancy: Secondary | ICD-10-CM

## 2022-10-16 DIAGNOSIS — O365930 Maternal care for other known or suspected poor fetal growth, third trimester, not applicable or unspecified: Secondary | ICD-10-CM

## 2022-10-16 NOTE — Progress Notes (Signed)
   PRENATAL VISIT NOTE  Subjective:  Sydney Rice is a 27 y.o. G2P1001 at [redacted]w[redacted]d being seen today for ongoing prenatal care.  She is currently monitored for the following issues for this low-risk pregnancy and has Anemia in pregnancy; Nausea/vomiting in pregnancy; GBS bacteriuria; Supervision of low-risk pregnancy; History of gestational hypertension; and IUGR (intrauterine growth restriction) affecting care of mother on their problem list.  Patient reports no complaints.  Contractions: Not present. Vag. Bleeding: None.  Movement: Present. Denies leaking of fluid.   The following portions of the patient's history were reviewed and updated as appropriate: allergies, current medications, past family history, past medical history, past social history, past surgical history and problem list.   Objective:   Vitals:   10/16/22 1030  BP: 119/81  Pulse: 85  Weight: 118 lb 8 oz (53.8 kg)    Fetal Status: Fetal Heart Rate (bpm): 150 Fundal Height: 27 cm Movement: Present     General:  Alert, oriented and cooperative. Patient is in no acute distress.  Skin: Skin is warm and dry. No rash noted.   Cardiovascular: Normal heart rate noted  Respiratory: Normal respiratory effort, no problems with respiration noted  Abdomen: Soft, gravid, appropriate for gestational age.  Pain/Pressure: Present     Pelvic: Cervical exam deferred        Extremities: Normal range of motion.  Edema: None  Mental Status: Normal mood and affect. Normal behavior. Normal judgment and thought content.   Assessment and Plan:  Pregnancy: G2P1001 at [redacted]w[redacted]d 1. [redacted] weeks gestation of pregnancy  - Tdap vaccine greater than or equal to 7yo IM  2. Encounter for supervision of low-risk pregnancy, antepartum --Anticipatory guidance about next visits/weeks of pregnancy given.   3. Poor fetal growth affecting management of mother in third trimester, single or unspecified fetus --EFW 2.6 %, constitutionally small vs growth  restriction.  Discussed reasons for frequent [place], why dopplers, reasons we may recommend IOL.  Pt states understanding. --FH wnl today  4. Anemia during pregnancy in second trimester --NO s/sx, taking oral iron  Preterm labor symptoms and general obstetric precautions including but not limited to vaginal bleeding, contractions, leaking of fluid and fetal movement were reviewed in detail with the patient. Please refer to After Visit Summary for other counseling recommendations.   Return in about 2 weeks (around 10/30/2022) for Lab visit as scheduled, then pt needs note for work today.  Future Appointments  Date Time Provider Department Center  10/22/2022  2:15 PM [facility] NURSE [facility]  10/22/2022  2:30 PM [facility] US3 [facility]  10/27/2022  8:30 AM [facility] NURSE [facility]  10/27/2022  8:45 AM [facility] NST [facility]  10/28/2022  8:15 AM Federico Flake, MD [facility]  10/28/2022  8:50 AM [facility] LAB [facility]  11/06/2022  7:30 AM [facility] NURSE [facility]  11/06/2022  7:45 AM [facility] US6 [facility]  11/06/2022  9:45 AM [facility] NST [facility]  11/11/2022  1:45 PM [facility] NURSE [facility]  11/11/2022  2:00 PM [facility] US1 [facility]  11/11/2022  2:15 PM [facility] NST [facility]    Sharen Counter, CNM

## 2022-10-20 ENCOUNTER — Other Ambulatory Visit: Payer: Self-pay

## 2022-10-20 DIAGNOSIS — Z3490 Encounter for supervision of normal pregnancy, unspecified, unspecified trimester: Secondary | ICD-10-CM

## 2022-10-22 ENCOUNTER — Ambulatory Visit: Payer: Medicaid Other | Attending: Obstetrics

## 2022-10-22 ENCOUNTER — Ambulatory Visit: Payer: Medicaid Other | Admitting: *Deleted

## 2022-10-22 VITALS — BP 117/70 | HR 82

## 2022-10-22 DIAGNOSIS — Z3A28 28 weeks gestation of pregnancy: Secondary | ICD-10-CM

## 2022-10-22 DIAGNOSIS — O0933 Supervision of pregnancy with insufficient antenatal care, third trimester: Secondary | ICD-10-CM | POA: Diagnosis not present

## 2022-10-22 DIAGNOSIS — O365921 Maternal care for other known or suspected poor fetal growth, second trimester, fetus 1: Secondary | ICD-10-CM | POA: Diagnosis present

## 2022-10-22 DIAGNOSIS — O365930 Maternal care for other known or suspected poor fetal growth, third trimester, not applicable or unspecified: Secondary | ICD-10-CM

## 2022-10-22 DIAGNOSIS — O09293 Supervision of pregnancy with other poor reproductive or obstetric history, third trimester: Secondary | ICD-10-CM | POA: Diagnosis not present

## 2022-10-22 DIAGNOSIS — O0932 Supervision of pregnancy with insufficient antenatal care, second trimester: Secondary | ICD-10-CM | POA: Insufficient documentation

## 2022-10-22 DIAGNOSIS — O09292 Supervision of pregnancy with other poor reproductive or obstetric history, second trimester: Secondary | ICD-10-CM | POA: Insufficient documentation

## 2022-10-27 ENCOUNTER — Ambulatory Visit: Payer: Medicaid Other | Attending: Obstetrics and Gynecology | Admitting: *Deleted

## 2022-10-27 ENCOUNTER — Ambulatory Visit: Payer: Medicaid Other | Admitting: *Deleted

## 2022-10-27 VITALS — BP 123/78 | HR 71

## 2022-10-27 DIAGNOSIS — O219 Vomiting of pregnancy, unspecified: Secondary | ICD-10-CM

## 2022-10-27 DIAGNOSIS — O365930 Maternal care for other known or suspected poor fetal growth, third trimester, not applicable or unspecified: Secondary | ICD-10-CM | POA: Insufficient documentation

## 2022-10-27 DIAGNOSIS — Z3493 Encounter for supervision of normal pregnancy, unspecified, third trimester: Secondary | ICD-10-CM

## 2022-10-27 DIAGNOSIS — R8271 Bacteriuria: Secondary | ICD-10-CM

## 2022-10-27 DIAGNOSIS — Z3A29 29 weeks gestation of pregnancy: Secondary | ICD-10-CM | POA: Insufficient documentation

## 2022-10-27 NOTE — Procedures (Signed)
Sydney Rice 1996-03-08 [redacted]w[redacted]d  Fetus A Non-Stress Test Interpretation for 10/27/22  Indication: IUGR  Fetal Heart Rate A Mode: External Baseline Rate (A): 155 bpm Variability: Moderate Accelerations: 10 x 10 Decelerations: None Multiple birth?: No  Uterine Activity Mode: Palpation, Toco Contraction Frequency (min): Occas Contraction Quality: Mild Resting Tone Palpated: Relaxed Resting Time: Adequate  Interpretation (Fetal Testing) Nonstress Test Interpretation: Reactive Overall Impression: Reassuring for gestational age Comments: Dr. Grace Bushy reviewed tracing.

## 2022-10-28 ENCOUNTER — Ambulatory Visit (INDEPENDENT_AMBULATORY_CARE_PROVIDER_SITE_OTHER): Payer: Medicaid Other | Admitting: Family Medicine

## 2022-10-28 ENCOUNTER — Other Ambulatory Visit: Payer: Self-pay

## 2022-10-28 ENCOUNTER — Encounter: Payer: Self-pay | Admitting: Family Medicine

## 2022-10-28 ENCOUNTER — Other Ambulatory Visit: Payer: Medicaid Other

## 2022-10-28 VITALS — BP 122/82 | HR 98 | Wt 120.0 lb

## 2022-10-28 DIAGNOSIS — Z3A29 29 weeks gestation of pregnancy: Secondary | ICD-10-CM | POA: Diagnosis not present

## 2022-10-28 DIAGNOSIS — O99012 Anemia complicating pregnancy, second trimester: Secondary | ICD-10-CM

## 2022-10-28 DIAGNOSIS — Z3490 Encounter for supervision of normal pregnancy, unspecified, unspecified trimester: Secondary | ICD-10-CM

## 2022-10-28 DIAGNOSIS — Z8759 Personal history of other complications of pregnancy, childbirth and the puerperium: Secondary | ICD-10-CM

## 2022-10-28 DIAGNOSIS — R8271 Bacteriuria: Secondary | ICD-10-CM

## 2022-10-28 DIAGNOSIS — O99013 Anemia complicating pregnancy, third trimester: Secondary | ICD-10-CM

## 2022-10-28 DIAGNOSIS — O365930 Maternal care for other known or suspected poor fetal growth, third trimester, not applicable or unspecified: Secondary | ICD-10-CM

## 2022-10-28 DIAGNOSIS — O0990 Supervision of high risk pregnancy, unspecified, unspecified trimester: Secondary | ICD-10-CM

## 2022-10-28 DIAGNOSIS — O0993 Supervision of high risk pregnancy, unspecified, third trimester: Secondary | ICD-10-CM

## 2022-10-28 MED ORDER — FERROUS GLUCONATE 324 (38 FE) MG PO TABS
324.0000 mg | ORAL_TABLET | ORAL | 3 refills | Status: AC
Start: 2022-10-28 — End: ?

## 2022-10-28 NOTE — Progress Notes (Signed)
   PRENATAL VISIT NOTE  Subjective:  Sydney Rice is a 27 y.o. G2P1001 at [redacted]w[redacted]d being seen today for ongoing prenatal care.  She is currently monitored for the following issues for this high-risk pregnancy and has Anemia in pregnancy; Nausea/vomiting in pregnancy; GBS bacteriuria; Supervision of low-risk pregnancy; History of gestational hypertension; and IUGR (intrauterine growth restriction) affecting care of mother on their problem list.  Patient reports no complaints.  Contractions: Irritability. Vag. Bleeding: None.  Movement: Present. Denies leaking of fluid.   The following portions of the patient's history were reviewed and updated as appropriate: allergies, current medications, past family history, past medical history, past social history, past surgical history and problem list.   Objective:   Vitals:   10/28/22 0824  BP: 122/82  Pulse: 98  Weight: 120 lb (54.4 kg)    Fetal Status: Fetal Heart Rate (bpm): 147   Movement: Present     General:  Alert, oriented and cooperative. Patient is in no acute distress.  Skin: Skin is warm and dry. No rash noted.   Cardiovascular: Normal heart rate noted  Respiratory: Normal respiratory effort, no problems with respiration noted  Abdomen: Soft, gravid, appropriate for gestational age.  Pain/Pressure: Absent     Pelvic: Cervical exam deferred        Extremities: Normal range of motion.     Mental Status: Normal mood and affect. Normal behavior. Normal judgment and thought content.   Assessment and Plan:  Pregnancy: G2P1001 at [redacted]w[redacted]d 1. Encounter for supervision of low-risk pregnancy in third trimester Vigorous movement FH appropriate but on lower side Discussed pregnancy support belts  2. Poor fetal growth affecting management of mother in third trimester, single or unspecified fetus Growth [place] scheduled  3. Anemia during pregnancy in second trimester Reports rxn to venofer in 2022 Declines IV Fe  She was on oral FE but  stopped due to nausea in early pregnancy Recommend Oral FE and use of orange juice to take to help absorption Recheck CBC in 4 wks  4. GBS bacteriuria PCN  5. History of gestational hypertension BP WNL Readmitted for PP PEC in 2022  Preterm labor symptoms and general obstetric precautions including but not limited to vaginal bleeding, contractions, leaking of fluid and fetal movement were reviewed in detail with the patient. Please refer to After Visit Summary for other counseling recommendations.   No follow-ups on file.  Future Appointments  Date Time Provider Department Center  10/28/2022  8:50 AM [facility] LAB [facility]  11/06/2022  7:30 AM [facility] NURSE [facility]  11/06/2022  7:45 AM [facility] US6 [facility]  11/06/2022  9:45 AM [facility] NST [facility]  11/11/2022  1:45 PM [facility] NURSE [facility]  11/11/2022  2:00 PM [facility] US1 [facility]  11/11/2022  2:15 PM [facility] NST [facility]    Federico Flake, MD

## 2022-10-29 LAB — CBC
Hematocrit: 29 % — ABNORMAL LOW (ref 34.0–46.6)
Hemoglobin: 9.7 g/dL — ABNORMAL LOW (ref 11.1–15.9)
MCH: 31.5 pg (ref 26.6–33.0)
MCHC: 33.4 g/dL (ref 31.5–35.7)
MCV: 94 fL (ref 79–97)
Platelets: 205 10*3/uL (ref 150–450)
RBC: 3.08 x10E6/uL — ABNORMAL LOW (ref 3.77–5.28)
RDW: 12.5 % (ref 11.7–15.4)
WBC: 6.4 10*3/uL (ref 3.4–10.8)

## 2022-10-29 LAB — FERRITIN: Ferritin: 15 ng/mL (ref 15–150)

## 2022-10-29 LAB — GLUCOSE TOLERANCE, 2 HOURS W/ 1HR
Glucose, 1 hour: 104 mg/dL (ref 70–179)
Glucose, 2 hour: 65 mg/dL — ABNORMAL LOW (ref 70–152)
Glucose, Fasting: 74 mg/dL (ref 70–91)

## 2022-10-29 LAB — HIV ANTIBODY (ROUTINE TESTING W REFLEX): HIV Screen 4th Generation wRfx: NONREACTIVE

## 2022-10-29 LAB — RPR: RPR Ser Ql: NONREACTIVE

## 2022-11-03 ENCOUNTER — Other Ambulatory Visit: Payer: Self-pay | Admitting: *Deleted

## 2022-11-03 NOTE — Addendum Note (Signed)
Addended by: Isabell Jarvis on: 11/03/2022 09:22 AM   Modules accepted: Orders

## 2022-11-06 ENCOUNTER — Ambulatory Visit: Payer: Medicaid Other | Admitting: *Deleted

## 2022-11-06 ENCOUNTER — Ambulatory Visit (HOSPITAL_BASED_OUTPATIENT_CLINIC_OR_DEPARTMENT_OTHER): Payer: Medicaid Other | Admitting: *Deleted

## 2022-11-06 ENCOUNTER — Ambulatory Visit: Payer: Medicaid Other | Attending: Obstetrics and Gynecology

## 2022-11-06 VITALS — BP 117/80 | HR 74

## 2022-11-06 DIAGNOSIS — Z3A30 30 weeks gestation of pregnancy: Secondary | ICD-10-CM | POA: Insufficient documentation

## 2022-11-06 DIAGNOSIS — O219 Vomiting of pregnancy, unspecified: Secondary | ICD-10-CM | POA: Diagnosis present

## 2022-11-06 DIAGNOSIS — O09293 Supervision of pregnancy with other poor reproductive or obstetric history, third trimester: Secondary | ICD-10-CM

## 2022-11-06 DIAGNOSIS — O365929 Maternal care for other known or suspected poor fetal growth, second trimester, other fetus: Secondary | ICD-10-CM | POA: Diagnosis not present

## 2022-11-06 DIAGNOSIS — O365930 Maternal care for other known or suspected poor fetal growth, third trimester, not applicable or unspecified: Secondary | ICD-10-CM | POA: Diagnosis not present

## 2022-11-06 DIAGNOSIS — O0990 Supervision of high risk pregnancy, unspecified, unspecified trimester: Secondary | ICD-10-CM | POA: Diagnosis present

## 2022-11-06 DIAGNOSIS — O365990 Maternal care for other known or suspected poor fetal growth, unspecified trimester, not applicable or unspecified: Secondary | ICD-10-CM | POA: Diagnosis present

## 2022-11-06 DIAGNOSIS — R8271 Bacteriuria: Secondary | ICD-10-CM | POA: Diagnosis present

## 2022-11-06 DIAGNOSIS — O0933 Supervision of pregnancy with insufficient antenatal care, third trimester: Secondary | ICD-10-CM

## 2022-11-06 NOTE — Procedures (Signed)
Sydney Rice 1996/07/13 [redacted]w[redacted]d  Fetus A Non-Stress Test Interpretation for 11/06/22  Indication: IUGR  Fetal Heart Rate A Mode: External Baseline Rate (A): 150 bpm Variability: Moderate Accelerations: 15 x 15 Decelerations: None Multiple birth?: No  Uterine Activity Mode: Toco Contraction Frequency (min): none Resting Tone Palpated: Relaxed  Interpretation (Fetal Testing) Nonstress Test Interpretation: Reactive Overall Impression: Reassuring for gestational age Comments: Tracing reviewed by Dr. Parke Poisson

## 2022-11-11 ENCOUNTER — Ambulatory Visit: Payer: Medicaid Other | Admitting: *Deleted

## 2022-11-11 ENCOUNTER — Ambulatory Visit: Payer: Medicaid Other | Attending: Obstetrics and Gynecology

## 2022-11-11 ENCOUNTER — Other Ambulatory Visit: Payer: Self-pay | Admitting: *Deleted

## 2022-11-11 ENCOUNTER — Ambulatory Visit (HOSPITAL_BASED_OUTPATIENT_CLINIC_OR_DEPARTMENT_OTHER): Payer: Medicaid Other | Admitting: *Deleted

## 2022-11-11 ENCOUNTER — Ambulatory Visit (INDEPENDENT_AMBULATORY_CARE_PROVIDER_SITE_OTHER): Payer: Medicaid Other | Admitting: Obstetrics & Gynecology

## 2022-11-11 ENCOUNTER — Encounter: Payer: Self-pay | Admitting: Obstetrics & Gynecology

## 2022-11-11 VITALS — BP 123/79 | HR 74 | Wt 121.2 lb

## 2022-11-11 VITALS — BP 135/82 | HR 75

## 2022-11-11 DIAGNOSIS — Z3A31 31 weeks gestation of pregnancy: Secondary | ICD-10-CM

## 2022-11-11 DIAGNOSIS — O09293 Supervision of pregnancy with other poor reproductive or obstetric history, third trimester: Secondary | ICD-10-CM

## 2022-11-11 DIAGNOSIS — O219 Vomiting of pregnancy, unspecified: Secondary | ICD-10-CM | POA: Diagnosis present

## 2022-11-11 DIAGNOSIS — O365930 Maternal care for other known or suspected poor fetal growth, third trimester, not applicable or unspecified: Secondary | ICD-10-CM | POA: Diagnosis present

## 2022-11-11 DIAGNOSIS — O0933 Supervision of pregnancy with insufficient antenatal care, third trimester: Secondary | ICD-10-CM | POA: Diagnosis not present

## 2022-11-11 DIAGNOSIS — O0990 Supervision of high risk pregnancy, unspecified, unspecified trimester: Secondary | ICD-10-CM | POA: Diagnosis present

## 2022-11-11 DIAGNOSIS — O365990 Maternal care for other known or suspected poor fetal growth, unspecified trimester, not applicable or unspecified: Secondary | ICD-10-CM | POA: Insufficient documentation

## 2022-11-11 DIAGNOSIS — R8271 Bacteriuria: Secondary | ICD-10-CM | POA: Insufficient documentation

## 2022-11-11 DIAGNOSIS — O0993 Supervision of high risk pregnancy, unspecified, third trimester: Secondary | ICD-10-CM

## 2022-11-11 NOTE — Procedures (Signed)
Sydney Rice Aug 26, 1995 [redacted]w[redacted]d  Fetus A Non-Stress Test Interpretation for 11/11/22  Indication: IUGR  Fetal Heart Rate A Mode: External Baseline Rate (A): 145 bpm Variability: Moderate Accelerations: 15 x 15 Decelerations: None Multiple birth?: No  Uterine Activity Mode: Toco Contraction Frequency (min): none Resting Tone Palpated: Relaxed  Interpretation (Fetal Testing) Nonstress Test Interpretation: Reactive Overall Impression: Reassuring for gestational age Comments: Tracing reviewed byDr.Fang

## 2022-11-11 NOTE — Progress Notes (Signed)
   PRENATAL VISIT NOTE  Subjective:  Sydney Rice is a 27 y.o. G2P1001 at [redacted]w[redacted]d being seen today for ongoing prenatal care.  She is currently monitored for the following issues for this high-risk pregnancy and has Anemia in pregnancy; GBS bacteriuria; Supervision of high risk pregnancy, antepartum; Hx of preeclampsia, prior pregnancy, currently pregnant; and IUGR (intrauterine growth restriction) affecting care of mother on their problem list.  Patient reports no complaints.  Contractions: Not present. Vag. Bleeding: None.  Movement: Present. Denies leaking of fluid.   The following portions of the patient's history were reviewed and updated as appropriate: allergies, current medications, past family history, past medical history, past social history, past surgical history and problem list.   Objective:   Vitals:   11/11/22 1545  BP: 123/79  Pulse: 74  Weight: 121 lb 3.2 oz (55 kg)    Fetal Status: Fetal Heart Rate (bpm): 147   Movement: Present     General:  Alert, oriented and cooperative. Patient is in no acute distress.  Skin: Skin is warm and dry. No rash noted.   Cardiovascular: Normal heart rate noted  Respiratory: Normal respiratory effort, no problems with respiration noted  Abdomen: Soft, gravid, appropriate for gestational age.  Pain/Pressure: Absent     Pelvic: Cervical exam deferred        Extremities: Normal range of motion.  Edema: None  Mental Status: Normal mood and affect. Normal behavior. Normal judgment and thought content.   Assessment and Plan:  Pregnancy: G2P1001 at [redacted]w[redacted]d 1. Supervision of high risk pregnancy, antepartum -Continue with Iron supplement every other day. -Follow up in 4 weeks. -Check iron before 36 weeks.  2. Poor fetal growth affecting management of mother in third trimester, single or unspecified fetus -Follow fetal growth via ultrasound on 11/17/2022.  Preterm labor symptoms and general obstetric precautions including but not  limited to vaginal bleeding, contractions, leaking of fluid and fetal movement were reviewed in detail with the patient. Please refer to After Visit Summary for other counseling recommendations.   No follow-ups on file.  Future Appointments  Date Time Provider Department Center  11/17/2022  1:30 PM [facility] NURSE [facility]  11/17/2022  1:45 PM [facility] US6 [facility]  12/03/2022 11:15 AM Corlis Hove, NP [facility]  12/09/2022  1:45 PM [facility] NURSE [facility]  12/09/2022  2:00 PM [facility] US1 [facility]  12/16/2022 11:15 AM [facility] NURSE [facility]  12/16/2022 11:30 AM [facility] US2 [facility]    Mason Minor, Student-PA

## 2022-11-17 ENCOUNTER — Encounter: Payer: Self-pay | Admitting: *Deleted

## 2022-11-17 ENCOUNTER — Ambulatory Visit: Payer: Medicaid Other | Attending: Obstetrics

## 2022-11-17 ENCOUNTER — Ambulatory Visit: Payer: Medicaid Other | Admitting: *Deleted

## 2022-11-17 VITALS — BP 118/71 | HR 70

## 2022-11-17 DIAGNOSIS — R8271 Bacteriuria: Secondary | ICD-10-CM

## 2022-11-17 DIAGNOSIS — O09293 Supervision of pregnancy with other poor reproductive or obstetric history, third trimester: Secondary | ICD-10-CM

## 2022-11-17 DIAGNOSIS — O0990 Supervision of high risk pregnancy, unspecified, unspecified trimester: Secondary | ICD-10-CM | POA: Diagnosis present

## 2022-11-17 DIAGNOSIS — O0933 Supervision of pregnancy with insufficient antenatal care, third trimester: Secondary | ICD-10-CM | POA: Diagnosis not present

## 2022-11-17 DIAGNOSIS — O365930 Maternal care for other known or suspected poor fetal growth, third trimester, not applicable or unspecified: Secondary | ICD-10-CM | POA: Diagnosis present

## 2022-11-17 DIAGNOSIS — Z3A32 32 weeks gestation of pregnancy: Secondary | ICD-10-CM | POA: Diagnosis not present

## 2022-11-18 ENCOUNTER — Ambulatory Visit: Payer: Medicaid Other

## 2022-11-23 ENCOUNTER — Ambulatory Visit: Payer: Medicaid Other | Admitting: *Deleted

## 2022-11-23 ENCOUNTER — Ambulatory Visit: Payer: Medicaid Other | Attending: Obstetrics

## 2022-11-23 VITALS — BP 131/81 | HR 73

## 2022-11-23 DIAGNOSIS — R8271 Bacteriuria: Secondary | ICD-10-CM | POA: Insufficient documentation

## 2022-11-23 DIAGNOSIS — O09293 Supervision of pregnancy with other poor reproductive or obstetric history, third trimester: Secondary | ICD-10-CM

## 2022-11-23 DIAGNOSIS — O0990 Supervision of high risk pregnancy, unspecified, unspecified trimester: Secondary | ICD-10-CM | POA: Diagnosis present

## 2022-11-23 DIAGNOSIS — O365930 Maternal care for other known or suspected poor fetal growth, third trimester, not applicable or unspecified: Secondary | ICD-10-CM | POA: Insufficient documentation

## 2022-11-23 DIAGNOSIS — Z3A33 33 weeks gestation of pregnancy: Secondary | ICD-10-CM

## 2022-11-23 DIAGNOSIS — O0933 Supervision of pregnancy with insufficient antenatal care, third trimester: Secondary | ICD-10-CM | POA: Diagnosis not present

## 2022-11-30 ENCOUNTER — Ambulatory Visit: Payer: Medicaid Other

## 2022-12-02 ENCOUNTER — Ambulatory Visit: Payer: Medicaid Other | Attending: Obstetrics

## 2022-12-02 ENCOUNTER — Ambulatory Visit: Payer: Medicaid Other | Admitting: *Deleted

## 2022-12-02 VITALS — BP 127/74 | HR 87

## 2022-12-02 DIAGNOSIS — R8271 Bacteriuria: Secondary | ICD-10-CM | POA: Insufficient documentation

## 2022-12-02 DIAGNOSIS — O0990 Supervision of high risk pregnancy, unspecified, unspecified trimester: Secondary | ICD-10-CM

## 2022-12-02 DIAGNOSIS — O365930 Maternal care for other known or suspected poor fetal growth, third trimester, not applicable or unspecified: Secondary | ICD-10-CM

## 2022-12-02 DIAGNOSIS — O09293 Supervision of pregnancy with other poor reproductive or obstetric history, third trimester: Secondary | ICD-10-CM | POA: Diagnosis not present

## 2022-12-02 DIAGNOSIS — Z3A34 34 weeks gestation of pregnancy: Secondary | ICD-10-CM

## 2022-12-02 DIAGNOSIS — O0933 Supervision of pregnancy with insufficient antenatal care, third trimester: Secondary | ICD-10-CM

## 2022-12-03 ENCOUNTER — Ambulatory Visit (INDEPENDENT_AMBULATORY_CARE_PROVIDER_SITE_OTHER): Payer: Medicaid Other | Admitting: Obstetrics and Gynecology

## 2022-12-03 ENCOUNTER — Other Ambulatory Visit: Payer: Self-pay

## 2022-12-03 ENCOUNTER — Encounter: Payer: Self-pay | Admitting: Obstetrics and Gynecology

## 2022-12-03 VITALS — BP 125/86 | HR 105 | Wt 124.0 lb

## 2022-12-03 DIAGNOSIS — R8271 Bacteriuria: Secondary | ICD-10-CM

## 2022-12-03 DIAGNOSIS — O365930 Maternal care for other known or suspected poor fetal growth, third trimester, not applicable or unspecified: Secondary | ICD-10-CM

## 2022-12-03 DIAGNOSIS — Z3A34 34 weeks gestation of pregnancy: Secondary | ICD-10-CM

## 2022-12-03 DIAGNOSIS — O09293 Supervision of pregnancy with other poor reproductive or obstetric history, third trimester: Secondary | ICD-10-CM

## 2022-12-03 DIAGNOSIS — O99013 Anemia complicating pregnancy, third trimester: Secondary | ICD-10-CM

## 2022-12-03 DIAGNOSIS — O09299 Supervision of pregnancy with other poor reproductive or obstetric history, unspecified trimester: Secondary | ICD-10-CM

## 2022-12-03 DIAGNOSIS — O0993 Supervision of high risk pregnancy, unspecified, third trimester: Secondary | ICD-10-CM

## 2022-12-03 DIAGNOSIS — O0990 Supervision of high risk pregnancy, unspecified, unspecified trimester: Secondary | ICD-10-CM

## 2022-12-03 NOTE — Progress Notes (Signed)
   PRENATAL VISIT NOTE  Subjective:  Sydney Rice is a 27 y.o. G2P1001 at [redacted]w[redacted]d being seen today for ongoing prenatal care.  She is currently monitored for the following issues for this high-risk pregnancy and has Anemia in pregnancy; GBS bacteriuria; Supervision of high risk pregnancy, antepartum; Hx of preeclampsia, prior pregnancy, currently pregnant; and IUGR (intrauterine growth restriction) affecting care of mother on their problem list.  Patient reports no complaints.  Contractions: Not present. Vag. Bleeding: None.  Movement: Present. Denies leaking of fluid.   The following portions of the patient's history were reviewed and updated as appropriate: allergies, current medications, past family history, past medical history, past social history, past surgical history and problem list.   Objective:   Vitals:   12/03/22 1202 12/03/22 1221  BP: (!) 134/96 125/86  Pulse: (!) 105   Weight: 124 lb (56.2 kg)     Fetal Status: Fetal Heart Rate (bpm): 152 Fundal Height: 31 cm Movement: Present     General:  Alert, oriented and cooperative. Patient is in no acute distress.  Skin: Skin is warm and dry. No rash noted.   Cardiovascular: Normal heart rate noted  Respiratory: Normal respiratory effort, no problems with respiration noted  Abdomen: Soft, gravid, appropriate for gestational age.  Pain/Pressure: Present     Pelvic: Cervical exam deferred        Extremities: Normal range of motion.  Edema: None  Mental Status: Normal mood and affect. Normal behavior. Normal judgment and thought content.   Assessment and Plan:  Pregnancy: G2P1001 at [redacted]w[redacted]d 1. Supervision of high risk pregnancy, antepartum FHR normal, feel vigorous movement  FH c/w fetal growth  Anticipatory guidance on 36 week appt   2. Poor fetal growth affecting management of mother in third trimester, single or unspecified fetus 5/22 u/s EFW 4.3% BPP 8/8, normal dopplers Follow up dopplers and antenatal testing as  scheduled  Per MFM yesterday, likely delivery around 38-39wks  3. History of pre-eclampsia in prior pregnancy, currently pregnant Continue ASA  Initial elevated, repeat normotensive.  Precautions provided when to follow up  4. Anemia during pregnancy in third trimester 4/17 hgb 9.7 Continue oral iron  Checking CBC today   5. GBS bacteriuria PCN in labor   Preterm labor symptoms and general obstetric precautions including but not limited to vaginal bleeding, contractions, leaking of fluid and fetal movement were reviewed in detail with the patient. Please refer to After Visit Summary for other counseling recommendations.   Return in about 2 weeks (around 12/17/2022) for OB VISIT (MD or APP).  Future Appointments  Date Time Provider Department Center  12/09/2022  1:45 PM [facility] NURSE [facility]  12/09/2022  2:00 PM [facility] US1 [facility]  12/16/2022 11:15 AM [facility] NURSE [facility]  12/16/2022 11:30 AM [facility] US2 [facility]  12/17/2022  1:55 PM Adam Phenix, MD [facility]    Albertine Grates, FNP

## 2022-12-04 LAB — CBC
Hematocrit: 29.6 % — ABNORMAL LOW (ref 34.0–46.6)
Hemoglobin: 10.2 g/dL — ABNORMAL LOW (ref 11.1–15.9)
MCH: 31.7 pg (ref 26.6–33.0)
MCHC: 34.5 g/dL (ref 31.5–35.7)
MCV: 92 fL (ref 79–97)
Platelets: 242 10*3/uL (ref 150–450)
RBC: 3.22 x10E6/uL — ABNORMAL LOW (ref 3.77–5.28)
RDW: 12.4 % (ref 11.7–15.4)
WBC: 6.4 10*3/uL (ref 3.4–10.8)

## 2022-12-09 ENCOUNTER — Ambulatory Visit: Payer: Medicaid Other | Admitting: *Deleted

## 2022-12-09 ENCOUNTER — Ambulatory Visit: Payer: Medicaid Other | Attending: Obstetrics

## 2022-12-09 VITALS — BP 138/84 | HR 78

## 2022-12-09 DIAGNOSIS — O0990 Supervision of high risk pregnancy, unspecified, unspecified trimester: Secondary | ICD-10-CM | POA: Diagnosis present

## 2022-12-09 DIAGNOSIS — O09293 Supervision of pregnancy with other poor reproductive or obstetric history, third trimester: Secondary | ICD-10-CM

## 2022-12-09 DIAGNOSIS — R8271 Bacteriuria: Secondary | ICD-10-CM

## 2022-12-09 DIAGNOSIS — O0933 Supervision of pregnancy with insufficient antenatal care, third trimester: Secondary | ICD-10-CM | POA: Diagnosis not present

## 2022-12-09 DIAGNOSIS — Z3A35 35 weeks gestation of pregnancy: Secondary | ICD-10-CM | POA: Diagnosis not present

## 2022-12-09 DIAGNOSIS — O365930 Maternal care for other known or suspected poor fetal growth, third trimester, not applicable or unspecified: Secondary | ICD-10-CM | POA: Insufficient documentation

## 2022-12-10 ENCOUNTER — Other Ambulatory Visit: Payer: Self-pay | Admitting: *Deleted

## 2022-12-10 DIAGNOSIS — O365990 Maternal care for other known or suspected poor fetal growth, unspecified trimester, not applicable or unspecified: Secondary | ICD-10-CM

## 2022-12-14 ENCOUNTER — Telehealth: Payer: Self-pay

## 2022-12-14 ENCOUNTER — Encounter: Payer: Self-pay | Admitting: *Deleted

## 2022-12-14 NOTE — Telephone Encounter (Signed)
Left message of Wednesday 12/23/22 ultrasound appointment.  Arrive at 10:30am for 10:45am ultrasound appointment.  If this does not work please call us back and we will reschedule

## 2022-12-16 ENCOUNTER — Ambulatory Visit: Payer: Medicaid Other | Admitting: *Deleted

## 2022-12-16 ENCOUNTER — Ambulatory Visit: Payer: Medicaid Other | Attending: Obstetrics

## 2022-12-16 VITALS — BP 141/88 | HR 78

## 2022-12-16 DIAGNOSIS — O365930 Maternal care for other known or suspected poor fetal growth, third trimester, not applicable or unspecified: Secondary | ICD-10-CM | POA: Insufficient documentation

## 2022-12-16 DIAGNOSIS — O0990 Supervision of high risk pregnancy, unspecified, unspecified trimester: Secondary | ICD-10-CM

## 2022-12-16 DIAGNOSIS — O09293 Supervision of pregnancy with other poor reproductive or obstetric history, third trimester: Secondary | ICD-10-CM | POA: Diagnosis not present

## 2022-12-16 DIAGNOSIS — O0933 Supervision of pregnancy with insufficient antenatal care, third trimester: Secondary | ICD-10-CM | POA: Diagnosis not present

## 2022-12-16 DIAGNOSIS — Z3A36 36 weeks gestation of pregnancy: Secondary | ICD-10-CM | POA: Diagnosis not present

## 2022-12-17 ENCOUNTER — Ambulatory Visit (INDEPENDENT_AMBULATORY_CARE_PROVIDER_SITE_OTHER): Payer: Medicaid Other | Admitting: Obstetrics & Gynecology

## 2022-12-17 ENCOUNTER — Other Ambulatory Visit: Payer: Self-pay

## 2022-12-17 ENCOUNTER — Other Ambulatory Visit (HOSPITAL_COMMUNITY)
Admission: RE | Admit: 2022-12-17 | Discharge: 2022-12-17 | Disposition: A | Discharge status: Home / Self Care | Payer: Medicaid Other | Source: Ambulatory Visit | Attending: Obstetrics & Gynecology | Admitting: Obstetrics & Gynecology

## 2022-12-17 VITALS — BP 142/101 | HR 73 | Wt 124.0 lb

## 2022-12-17 DIAGNOSIS — O09293 Supervision of pregnancy with other poor reproductive or obstetric history, third trimester: Secondary | ICD-10-CM

## 2022-12-17 DIAGNOSIS — O0993 Supervision of high risk pregnancy, unspecified, third trimester: Secondary | ICD-10-CM

## 2022-12-17 DIAGNOSIS — Z3A36 36 weeks gestation of pregnancy: Secondary | ICD-10-CM

## 2022-12-17 DIAGNOSIS — O0990 Supervision of high risk pregnancy, unspecified, unspecified trimester: Secondary | ICD-10-CM

## 2022-12-17 DIAGNOSIS — O09299 Supervision of pregnancy with other poor reproductive or obstetric history, unspecified trimester: Secondary | ICD-10-CM

## 2022-12-17 DIAGNOSIS — O365930 Maternal care for other known or suspected poor fetal growth, third trimester, not applicable or unspecified: Secondary | ICD-10-CM

## 2022-12-17 NOTE — Progress Notes (Signed)
   PRENATAL VISIT NOTE  Subjective:  Sydney Rice is a 27 y.o. G2P1001 at [redacted]w[redacted]d being seen today for ongoing prenatal care.  She is currently monitored for the following issues for this high-risk pregnancy and has Anemia in pregnancy; Supervision of high risk pregnancy, antepartum; Hx of preeclampsia, prior pregnancy, currently pregnant; and IUGR (intrauterine growth restriction) affecting care of mother on their problem list.  Patient reports no complaints.  Contractions: Irritability. Vag. Bleeding: None.  Movement: Present. Denies leaking of fluid.   The following portions of the patient's history were reviewed and updated as appropriate: allergies, current medications, past family history, past medical history, past social history, past surgical history and problem list.   Objective:   Vitals:   12/17/22 1422 12/17/22 1448  BP: (!) 138/102 (!) 142/101  Pulse: 79 73  Weight: 124 lb (56.2 kg)     Fetal Status: Fetal Heart Rate (bpm): 155   Movement: Present     General:  Alert, oriented and cooperative. Patient is in no acute distress.  Skin: Skin is warm and dry. No rash noted.   Cardiovascular: Normal heart rate noted  Respiratory: Normal respiratory effort, no problems with respiration noted  Abdomen: Soft, gravid, appropriate for gestational age.  Pain/Pressure: Present     Pelvic: Cervical exam deferred        Extremities: Normal range of motion.  Edema: None  Mental Status: Normal mood and affect. Normal behavior. Normal judgment and thought content.   Assessment and Plan:  Pregnancy: G2P1001 at [redacted]w[redacted]d 1. Supervision of high risk pregnancy, antepartum  - GC/Chlamydia probe amp ([place])not at [facility]  2. Hx of preeclampsia, prior pregnancy, currently pregnant Preeclampsia without SF needs iol 37 weeks  3. Poor fetal growth affecting management of mother in third trimester, single or unspecified fetus Reviewed results from yesterday  Preterm labor symptoms and  general obstetric precautions including but not limited to vaginal bleeding, contractions, leaking of fluid and fetal movement were reviewed in detail with the patient. Please refer to After Visit Summary for other counseling recommendations.   Return if symptoms worsen or fail to improve.  Future Appointments  Date Time Provider Department Center  12/23/2022 10:30 AM [facility] NURSE [facility]  12/23/2022 10:45 AM [facility] US6 [facility]  12/30/2022 10:45 AM [facility] NURSE [facility]  12/30/2022 11:00 AM [facility] US1 [facility]    Scheryl Darter, MD

## 2022-12-18 LAB — GC/CHLAMYDIA PROBE AMP (~~LOC~~) NOT AT ARMC
Chlamydia: NEGATIVE
Comment: NEGATIVE
Comment: NORMAL
Neisseria Gonorrhea: NEGATIVE

## 2022-12-20 ENCOUNTER — Inpatient Hospital Stay (HOSPITAL_COMMUNITY)
Admission: RE | Admit: 2022-12-20 | Discharge: 2022-12-24 | DRG: 806 | Disposition: A | Discharge status: Home / Self Care | Payer: Medicaid Other | Attending: Obstetrics and Gynecology | Admitting: Obstetrics and Gynecology

## 2022-12-20 ENCOUNTER — Inpatient Hospital Stay (HOSPITAL_COMMUNITY): Payer: Medicaid Other

## 2022-12-20 ENCOUNTER — Encounter (HOSPITAL_COMMUNITY): Payer: Self-pay | Admitting: Obstetrics & Gynecology

## 2022-12-20 ENCOUNTER — Inpatient Hospital Stay (HOSPITAL_COMMUNITY): Payer: Medicaid Other | Admitting: Anesthesiology

## 2022-12-20 DIAGNOSIS — D509 Iron deficiency anemia, unspecified: Secondary | ICD-10-CM | POA: Diagnosis present

## 2022-12-20 DIAGNOSIS — Z7982 Long term (current) use of aspirin: Secondary | ICD-10-CM

## 2022-12-20 DIAGNOSIS — O365930 Maternal care for other known or suspected poor fetal growth, third trimester, not applicable or unspecified: Principal | ICD-10-CM | POA: Diagnosis present

## 2022-12-20 DIAGNOSIS — O365990 Maternal care for other known or suspected poor fetal growth, unspecified trimester, not applicable or unspecified: Secondary | ICD-10-CM | POA: Diagnosis present

## 2022-12-20 DIAGNOSIS — Z3A37 37 weeks gestation of pregnancy: Secondary | ICD-10-CM

## 2022-12-20 DIAGNOSIS — O09299 Supervision of pregnancy with other poor reproductive or obstetric history, unspecified trimester: Secondary | ICD-10-CM

## 2022-12-20 DIAGNOSIS — O134 Gestational [pregnancy-induced] hypertension without significant proteinuria, complicating childbirth: Secondary | ICD-10-CM | POA: Diagnosis present

## 2022-12-20 DIAGNOSIS — O9902 Anemia complicating childbirth: Secondary | ICD-10-CM | POA: Diagnosis present

## 2022-12-20 DIAGNOSIS — O1494 Unspecified pre-eclampsia, complicating childbirth: Secondary | ICD-10-CM | POA: Diagnosis present

## 2022-12-20 DIAGNOSIS — O1493 Unspecified pre-eclampsia, third trimester: Secondary | ICD-10-CM

## 2022-12-20 DIAGNOSIS — O99824 Streptococcus B carrier state complicating childbirth: Secondary | ICD-10-CM | POA: Diagnosis present

## 2022-12-20 DIAGNOSIS — O0990 Supervision of high risk pregnancy, unspecified, unspecified trimester: Secondary | ICD-10-CM

## 2022-12-20 DIAGNOSIS — O99019 Anemia complicating pregnancy, unspecified trimester: Secondary | ICD-10-CM | POA: Diagnosis present

## 2022-12-20 DIAGNOSIS — O99820 Streptococcus B carrier state complicating pregnancy: Secondary | ICD-10-CM | POA: Diagnosis not present

## 2022-12-20 HISTORY — DX: Unspecified pre-eclampsia, third trimester: O14.93

## 2022-12-20 LAB — COMPREHENSIVE METABOLIC PANEL
ALT: 12 U/L (ref 0–44)
AST: 20 U/L (ref 15–41)
Albumin: 2.5 g/dL — ABNORMAL LOW (ref 3.5–5.0)
Alkaline Phosphatase: 92 U/L (ref 38–126)
Anion gap: 9 (ref 5–15)
BUN: 7 mg/dL (ref 6–20)
CO2: 20 mmol/L — ABNORMAL LOW (ref 22–32)
Calcium: 8.7 mg/dL — ABNORMAL LOW (ref 8.9–10.3)
Chloride: 107 mmol/L (ref 98–111)
Creatinine, Ser: 0.78 mg/dL (ref 0.44–1.00)
GFR, Estimated: >60 mL/min (ref >60)
Glucose, Bld: 99 mg/dL (ref 70–99)
Potassium: 3.5 mmol/L (ref 3.5–5.1)
Sodium: 136 mmol/L (ref 135–145)
Total Bilirubin: 0.4 mg/dL (ref 0.3–1.2)
Total Protein: 5.6 g/dL — ABNORMAL LOW (ref 6.5–8.1)

## 2022-12-20 LAB — CBC
HCT: 29.7 % — ABNORMAL LOW (ref 36.0–46.0)
Hemoglobin: 10 g/dL — ABNORMAL LOW (ref 12.0–15.0)
MCH: 30.7 pg (ref 26.0–34.0)
MCHC: 33.7 g/dL (ref 30.0–36.0)
MCV: 91.1 fL (ref 80.0–100.0)
Platelets: 225 10*3/uL (ref 150–400)
RBC: 3.26 MIL/uL — ABNORMAL LOW (ref 3.87–5.11)
RDW: 13.2 % (ref 11.5–15.5)
WBC: 6.8 10*3/uL (ref 4.0–10.5)
nRBC: 0 % (ref 0.0–0.2)

## 2022-12-20 LAB — PROTEIN / CREATININE RATIO, URINE
Creatinine, Urine: 52 mg/dL
Total Protein, Urine: <6 mg/dL

## 2022-12-20 LAB — TYPE AND SCREEN
ABO/RH(D): O POS
Antibody Screen: NEGATIVE

## 2022-12-20 MED ORDER — OXYCODONE-ACETAMINOPHEN 5-325 MG PO TABS: 1.0000 | ORAL_TABLET | ORAL | Status: DC | PRN

## 2022-12-20 MED ORDER — SODIUM CHLORIDE 0.9 % IV SOLN
5.0000 10*6.[IU] | Freq: Once | INTRAVENOUS | Status: AC
  Administered 2022-12-20: 5 10*6.[IU] via INTRAVENOUS
  Filled 2022-12-20: qty 5

## 2022-12-20 MED ORDER — TERBUTALINE SULFATE 1 MG/ML IJ SOLN: 0.2500 mg | Freq: Once | INTRAMUSCULAR | Status: DC | PRN

## 2022-12-20 MED ORDER — OXYTOCIN BOLUS FROM INFUSION
333.0000 mL | Freq: Once | INTRAVENOUS | Status: AC
  Administered 2022-12-21: 333 mL via INTRAVENOUS

## 2022-12-20 MED ORDER — PHENYLEPHRINE 80 MCG/ML (10ML) SYRINGE FOR IV PUSH (FOR BLOOD PRESSURE SUPPORT)
80.0000 ug | PREFILLED_SYRINGE | INTRAVENOUS | Status: DC | PRN
  Filled 2022-12-20: qty 10

## 2022-12-20 MED ORDER — LIDOCAINE HCL (PF) 1 % IJ SOLN: 30.0000 mL | INTRAMUSCULAR | Status: DC | PRN

## 2022-12-20 MED ORDER — FENTANYL-BUPIVACAINE-NACL 0.5-0.125-0.9 MG/250ML-% EP SOLN
12.0000 mL/h | EPIDURAL | Status: DC | PRN
  Administered 2022-12-20: 12 mL/h via EPIDURAL
  Filled 2022-12-20: qty 250

## 2022-12-20 MED ORDER — PENICILLIN G POT IN DEXTROSE 60000 UNIT/ML IV SOLN
3.0000 10*6.[IU] | INTRAVENOUS | Status: DC
  Administered 2022-12-20 – 2022-12-21 (×2): 3 10*6.[IU] via INTRAVENOUS
  Filled 2022-12-20 (×2): qty 50

## 2022-12-20 MED ORDER — EPHEDRINE 5 MG/ML INJ: 10.0000 mg | INTRAVENOUS | Status: DC | PRN

## 2022-12-20 MED ORDER — SOD CITRATE-CITRIC ACID 500-334 MG/5ML PO SOLN: 30.0000 mL | ORAL | Status: DC | PRN

## 2022-12-20 MED ORDER — ONDANSETRON HCL 4 MG/2ML IJ SOLN
4.0000 mg | Freq: Four times a day (QID) | INTRAMUSCULAR | Status: DC | PRN
  Administered 2022-12-21: 4 mg via INTRAVENOUS
  Filled 2022-12-20: qty 2

## 2022-12-20 MED ORDER — OXYTOCIN-SODIUM CHLORIDE 30-0.9 UT/500ML-% IV SOLN
1.0000 m[IU]/min | INTRAVENOUS | Status: DC
  Administered 2022-12-20: 2 m[IU]/min via INTRAVENOUS

## 2022-12-20 MED ORDER — ACETAMINOPHEN 325 MG PO TABS: 650.0000 mg | ORAL_TABLET | ORAL | Status: DC | PRN

## 2022-12-20 MED ORDER — LIDOCAINE HCL (PF) 1 % IJ SOLN
INTRAMUSCULAR | Status: DC | PRN
  Administered 2022-12-20: 10 mL via EPIDURAL

## 2022-12-20 MED ORDER — LACTATED RINGERS IV SOLN: 500.0000 mL | INTRAVENOUS | Status: DC | PRN

## 2022-12-20 MED ORDER — LACTATED RINGERS IV SOLN: INTRAVENOUS | Status: DC

## 2022-12-20 MED ORDER — PHENYLEPHRINE 80 MCG/ML (10ML) SYRINGE FOR IV PUSH (FOR BLOOD PRESSURE SUPPORT): 80.0000 ug | PREFILLED_SYRINGE | INTRAVENOUS | Status: DC | PRN

## 2022-12-20 MED ORDER — OXYTOCIN-SODIUM CHLORIDE 30-0.9 UT/500ML-% IV SOLN
2.5000 [IU]/h | INTRAVENOUS | Status: DC
  Filled 2022-12-20: qty 500

## 2022-12-20 MED ORDER — OXYCODONE-ACETAMINOPHEN 5-325 MG PO TABS: 2.0000 | ORAL_TABLET | ORAL | Status: DC | PRN

## 2022-12-20 MED ORDER — LACTATED RINGERS IV SOLN: 500.0000 mL | Freq: Once | INTRAVENOUS | Status: DC

## 2022-12-20 MED ORDER — DIPHENHYDRAMINE HCL 50 MG/ML IJ SOLN
12.5000 mg | INTRAMUSCULAR | Status: AC | PRN
  Administered 2022-12-21 (×3): 12.5 mg via INTRAVENOUS
  Filled 2022-12-20: qty 1

## 2022-12-20 NOTE — Progress Notes (Signed)
LABOR PROGRESS NOTE  Sydney Rice is a 27 y.o. G2P1001 at [redacted]w[redacted]d  admitted for IOL for IUGR  Subjective: Comfortable with epidural.   Objective: BP (!) 149/100   Pulse 69   Temp 98.2 F (36.8 C) (Oral)   Resp 16   LMP 04/04/2022   SpO2 100%  or  Vitals:   12/20/22 2222 12/20/22 2226 12/20/22 2232 12/20/22 2301  BP: (!) 143/98  (!) 138/90 (!) 149/100  Pulse: 75  67 69  Resp: 16  15 16   Temp:    98.2 F (36.8 C)  TempSrc:    Oral  SpO2:  100%      Dilation: 3 Effacement (%): 70 Station: -2 Presentation: Vertex Exam by:: [place] Turner RN FHT: baseline rate 140, moderate varibility, + acel, nodecel Toco: Q2-4 min  Labs: Lab Results  Component Value Date   WBC 6.8 12/20/2022   HGB 10.0 (L) 12/20/2022   HCT 29.7 (L) 12/20/2022   MCV 91.1 12/20/2022   PLT 225 12/20/2022    Patient Active Problem List   Diagnosis Date Noted   Preeclampsia, third trimester 12/20/2022   IUGR (intrauterine growth restriction) affecting care of mother 09/03/2022   Supervision of high risk pregnancy, antepartum 08/12/2022   Hx of preeclampsia, prior pregnancy, currently pregnant 08/12/2022   Anemia in pregnancy 10/29/2020    Assessment / Plan: 27 y.o. G2P1001 at [redacted]w[redacted]d here for IOL for IUGR  Labor: Progressing.  AROM with clear fluid.  Will titrate Pitocin.  Consider IUPC at next check if no change Fetal Wellbeing: Category 1 Pain Control: Epidural in place Anticipated MOD: Vaginal delivery  Derrel Nip, MD  OB Fellow  12/20/2022, 11:06 PM

## 2022-12-20 NOTE — H&P (Signed)
OBSTETRIC ADMISSION HISTORY AND PHYSICAL  Sydney Rice is a 27 y.o. female G2P1001 with IUP at [redacted]w[redacted]d by [place] presenting for IOL IUGR and gHTN. She reports +FMs, No LOF, no VB, no blurry vision, headaches or peripheral edema, and RUQ pain.  She plans on breast feeding. She request IUD for birth control. She received her prenatal care at [facility]   Dating: By [place] --->  Estimated Date of Delivery: 01/09/23  Sono:    @[redacted]w[redacted]d , CWD, normal anatomy, cephalic presentation, 1956g, 1.6% EFW   Prenatal History/Complications:  Patient Active Problem List   Diagnosis Date Noted   Preeclampsia, third trimester 12/20/2022   IUGR (intrauterine growth restriction) affecting care of mother 09/03/2022   Supervision of high risk pregnancy, antepartum 08/12/2022   Hx of preeclampsia, prior pregnancy, currently pregnant 08/12/2022   Anemia in pregnancy 10/29/2020    Nursing Staff Provider  Office Location [facility] Dating  01/10/2023, by Ultrasound  [facility] Model [x]  Traditional [ ]  Centering [ ]  Mom-Baby Dyad    Language  English Anatomy US  Wnl, FGR  Flu Vaccine  Declined 08/12/22  Genetic/Carrier Screen  NIPS:   LR panorama AFP:   wnl Horizon: neg 4  TDaP Vaccine   Given 10/16/22 Hgb A1C or  GTT Early - not done Third trimester   COVID Vaccine No received    LAB RESULTS   Rhogam  N/a Blood Type --/--/PENDING (06/09 1600) PENDING pos  Baby Feeding Plan breast Antibody PENDING (06/09 1600)neg  Contraception ?IUD Rubella 3.11 (01/31 1104)imm  Circumcision Na GIRL RPR Non Reactive (04/17 0853) neg  Pediatrician  List given, moving to CLT HBsAg Negative (01/31 1104) neg  Support Person dorien HCVAb Non Reactive (01/31 1104) neg  Prenatal Classes  HIV Non Reactive (04/17 0853)   neg  BTL Consent  GBS Positive/-- (11/08 0000) (For PCN allergy, check sensitivities)   VBAC Consent  Pap Diagnosis  Date Value Ref Range Status  02/13/2021   Final   - Negative for intraepithelial lesion or malignancy  (NILM)         DME Rx [ ]  BP cuff [ ]  Weight Scale Waterbirth  [ ]  Class [ ]  Consent [ ]  CNM visit  PHQ9 & GAD7 [  ] new OB [  x] 28 weeks  [  ] 36 weeks Induction  [ ]  Orders Entered [ ] Foley Y/N     Past Medical History: Past Medical History:  Diagnosis Date   GBS bacteriuria 05/22/2022   Too low to treat before labor   Gestational hypertension    Medical history non-contributory     Past Surgical History: Past Surgical History:  Procedure Laterality Date   NO PAST SURGERIES      Obstetrical History: OB History     Gravida  2   Para  1   Term  1   Preterm  0   AB  0   Living  1      SAB  0   IAB  0   Ectopic  0   Multiple  0   Live Births  1           Social History Social History   Socioeconomic History   Marital status: Single    Spouse name: Not on file   Number of children: Not on file   Years of education: Not on file   Highest education level: Not on file  Occupational History   Occupation: Student  Tobacco Use  Smoking status: Never   Smokeless tobacco: Never  Vaping Use   Vaping Use: Never used  Substance and Sexual Activity   Alcohol use: Not Currently   Drug use: Not Currently    Types: Marijuana    Comment: last smoked 1 yr   Sexual activity: Yes    Birth control/protection: None, Condom  Other Topics Concern   Not on file  Social History Narrative   Not on file   Social Determinants of Health   Financial Resource Strain: Not on file  Food Insecurity: No Food Insecurity (12/26/2020)   Hunger Vital Sign    Worried About Running Out of Food in the Last Year: Never true    Ran Out of Food in the Last Year: Never true  Transportation Needs: No Transportation Needs (12/26/2020)   PRAPARE - Administrator, Civil Service (Medical): No    Lack of Transportation (Non-Medical): No  Physical Activity: Not on file  Stress: Not on file  Social Connections: Not on file    Family History: Family History   Problem Relation Age of Onset   Hypertension Mother    Asthma Neg Hx    Cancer Neg Hx    Diabetes Neg Hx    Heart disease Neg Hx     Allergies: Allergies  Allergen Reactions   Venofer [Iron Sucrose] Hives, Itching, Nausea And Vomiting, Swelling and Other (See Comments)    Chills, swelling of both hands and feet, low back pain, chest tightness on inhalation    Medications Prior to Admission  Medication Sig Dispense Refill Last Dose   aspirin EC 81 MG tablet Take 1 tablet (81 mg total) by mouth daily. 60 tablet 1    ferrous gluconate (FERGON) 324 MG tablet Take 1 tablet (324 mg total) by mouth every other day. 60 tablet 3    Prenatal Vit-Fe Fumarate-FA (PRENATAL MULTIVITAMIN) TABS tablet Take 1 tablet by mouth daily at 12 noon. 60 tablet 1      Review of Systems   All systems reviewed and negative except as stated in HPI  Last menstrual period 04/04/2022, unknown if currently breastfeeding. General appearance: alert, cooperative, and appears stated age Lungs: clear to auscultation bilaterally Heart: regular rate and rhythm Abdomen: soft, non-tender; bowel sounds normal Pelvic: no lesions Extremities: Homans sign is negative, no sign of DVT Presentation: cephalic Fetal monitoringBaseline: 150  bpm, Variability: Good {> 6 bpm), Accelerations: Reactive, and Decelerations: Absent Uterine activityq 2-5 min     Prenatal labs: ABO, Rh: O/Positive/-- (01/31 1104) Antibody: Negative (01/31 1104) Rubella: 3.11 (01/31 1104) RPR: Non Reactive (04/17 0853)  HBsAg: Negative (01/31 1104)  HIV: Non Reactive (04/17 0853)  GBS: Positive/-- (11/08 0000)  1 hr Glucola nml Genetic screening  nml Anatomy US IUGR  Prenatal Transfer Tool  Maternal Diabetes: No Genetic Screening: Normal Maternal Ultrasounds/Referrals: IUGR Fetal Ultrasounds or other Referrals:  None Maternal Substance Abuse:  No Significant Maternal Medications:  None Significant Maternal Lab Results:  Group B Strep  positive Number of Prenatal Visits:greater than 3 verified prenatal visits Other Comments:  None  No results found for this or any previous visit (from the past 24 hour(s)).  Patient Active Problem List   Diagnosis Date Noted   Preeclampsia, third trimester 12/20/2022   IUGR (intrauterine growth restriction) affecting care of mother 09/03/2022   Supervision of high risk pregnancy, antepartum 08/12/2022   Hx of preeclampsia, prior pregnancy, currently pregnant 08/12/2022   Anemia in pregnancy 10/29/2020  Assessment/Plan:  Sydney Rice is a 27 y.o. G2P1001 at [redacted]w[redacted]d here for IOL IUGR  #Labor: pitocin 2x2 start #Pain: Per pt request #FWB: CAT 1 #ID:  GBS pos- PCN #MOF: Breast #MOC:IUD #gHTN: PreE labs pending. CTM  Myrtie Hawk, DO  12/20/2022, 4:19 PM

## 2022-12-20 NOTE — Anesthesia Procedure Notes (Signed)
Epidural Patient location during procedure: OB Start time: 12/20/2022 8:40 PM End time: 12/20/2022 8:50 PM  Staffing Anesthesiologist: Leilani Able, MD Performed: anesthesiologist   Preanesthetic Checklist Completed: patient identified, IV checked, site marked, risks and benefits discussed, surgical consent, monitors and equipment checked, pre-op evaluation and timeout performed  Epidural Patient position: sitting Prep: DuraPrep and site prepped and draped Patient monitoring: continuous pulse ox and blood pressure Approach: midline Location: L3-L4 Injection technique: LOR air  Needle:  Needle type: Tuohy  Needle gauge: 17 G Needle length: 9 cm and 9 Needle insertion depth: 5 cm Catheter type: closed end flexible Catheter size: 19 Gauge Catheter at skin depth: 10 cm Test dose: negative and Other  Assessment Events: blood not aspirated, no cerebrospinal fluid, injection not painful, no injection resistance, no paresthesia and negative IV test  Additional Notes Reason for block:procedure for pain

## 2022-12-20 NOTE — Anesthesia Preprocedure Evaluation (Signed)
Anesthesia Evaluation  Patient identified by MRN, date of birth, ID band Patient awake    Reviewed: Allergy & Precautions, H&P , NPO status , Patient's Chart, lab work & pertinent test results  History of Anesthesia Complications Negative for: history of anesthetic complications  Airway Mallampati: I       Dental no notable dental hx.    Pulmonary neg pulmonary ROS   Pulmonary exam normal        Cardiovascular hypertension, Normal cardiovascular exam Rate:Normal     Neuro/Psych negative neurological ROS  negative psych ROS   GI/Hepatic negative GI ROS, Neg liver ROS,,,  Endo/Other  negative endocrine ROS    Renal/GU negative Renal ROS  negative genitourinary   Musculoskeletal negative musculoskeletal ROS (+)    Abdominal Normal abdominal exam  (+)   Peds  Hematology  (+) Blood dyscrasia, anemia   Anesthesia Other Findings   Reproductive/Obstetrics (+) Pregnancy                             Anesthesia Physical Anesthesia Plan  ASA: 2  Anesthesia Plan: Epidural   Post-op Pain Management:    Induction:   PONV Risk Score and Plan:   Airway Management Planned:   Additional Equipment:   Intra-op Plan:   Post-operative Plan:   Informed Consent: I have reviewed the patients History and Physical, chart, labs and discussed the procedure including the risks, benefits and alternatives for the proposed anesthesia with the patient or authorized representative who has indicated his/her understanding and acceptance.       Plan Discussed with:   Anesthesia Plan Comments:         Anesthesia Quick Evaluation

## 2022-12-21 ENCOUNTER — Encounter (HOSPITAL_COMMUNITY): Payer: Self-pay | Admitting: Obstetrics & Gynecology

## 2022-12-21 DIAGNOSIS — O99820 Streptococcus B carrier state complicating pregnancy: Secondary | ICD-10-CM

## 2022-12-21 DIAGNOSIS — O134 Gestational [pregnancy-induced] hypertension without significant proteinuria, complicating childbirth: Secondary | ICD-10-CM

## 2022-12-21 DIAGNOSIS — Z3A37 37 weeks gestation of pregnancy: Secondary | ICD-10-CM

## 2022-12-21 DIAGNOSIS — O365930 Maternal care for other known or suspected poor fetal growth, third trimester, not applicable or unspecified: Secondary | ICD-10-CM

## 2022-12-21 LAB — RPR: RPR Ser Ql: NONREACTIVE

## 2022-12-21 MED ORDER — BENZOCAINE-MENTHOL 20-0.5 % EX AERO: 1.0000 | INHALATION_SPRAY | CUTANEOUS | Status: DC | PRN

## 2022-12-21 MED ORDER — FENTANYL CITRATE (PF) 100 MCG/2ML IJ SOLN
INTRAMUSCULAR | Status: AC
  Filled 2022-12-21: qty 2

## 2022-12-21 MED ORDER — TETANUS-DIPHTH-ACELL PERTUSSIS 5-2.5-18.5 LF-MCG/0.5 IM SUSY: 0.5000 mL | PREFILLED_SYRINGE | Freq: Once | INTRAMUSCULAR | Status: DC

## 2022-12-21 MED ORDER — FENTANYL CITRATE (PF) 100 MCG/2ML IJ SOLN
50.0000 ug | Freq: Once | INTRAMUSCULAR | Status: AC
  Administered 2022-12-21: 50 ug via INTRAVENOUS

## 2022-12-21 MED ORDER — PRENATAL MULTIVITAMIN CH
1.0000 | ORAL_TABLET | Freq: Every day | ORAL | Status: DC
  Administered 2022-12-22 – 2022-12-23 (×2): 1 via ORAL
  Filled 2022-12-21 (×2): qty 1

## 2022-12-21 MED ORDER — WITCH HAZEL-GLYCERIN EX PADS: 1.0000 | MEDICATED_PAD | CUTANEOUS | Status: DC | PRN

## 2022-12-21 MED ORDER — SENNOSIDES-DOCUSATE SODIUM 8.6-50 MG PO TABS
2.0000 | ORAL_TABLET | ORAL | Status: DC
  Administered 2022-12-23: 1 via ORAL
  Administered 2022-12-24: 2 via ORAL
  Filled 2022-12-21 (×3): qty 2

## 2022-12-21 MED ORDER — ACETAMINOPHEN 325 MG PO TABS
650.0000 mg | ORAL_TABLET | ORAL | Status: DC | PRN
  Administered 2022-12-21 (×3): 650 mg via ORAL
  Filled 2022-12-21 (×3): qty 2

## 2022-12-21 MED ORDER — SIMETHICONE 80 MG PO CHEW: 80.0000 mg | CHEWABLE_TABLET | ORAL | Status: DC | PRN

## 2022-12-21 MED ORDER — LOPERAMIDE HCL 2 MG PO CAPS
4.0000 mg | ORAL_CAPSULE | ORAL | Status: DC | PRN
  Administered 2022-12-21: 4 mg via ORAL
  Filled 2022-12-21: qty 2

## 2022-12-21 MED ORDER — DIBUCAINE (PERIANAL) 1 % EX OINT: 1.0000 | TOPICAL_OINTMENT | CUTANEOUS | Status: DC | PRN

## 2022-12-21 MED ORDER — CARBOPROST TROMETHAMINE 250 MCG/ML IM SOLN
INTRAMUSCULAR | Status: AC
  Administered 2022-12-21: 250 ug
  Filled 2022-12-21: qty 1

## 2022-12-21 MED ORDER — COCONUT OIL OIL: 1.0000 | TOPICAL_OIL | Status: DC | PRN

## 2022-12-21 MED ORDER — CARBOPROST TROMETHAMINE 250 MCG/ML IM SOLN: 250.0000 ug | Freq: Once | INTRAMUSCULAR | Status: AC

## 2022-12-21 MED ORDER — ONDANSETRON HCL 4 MG/2ML IJ SOLN: 4.0000 mg | INTRAMUSCULAR | Status: DC | PRN

## 2022-12-21 MED ORDER — FUROSEMIDE 20 MG PO TABS
20.0000 mg | ORAL_TABLET | Freq: Every day | ORAL | Status: DC
  Administered 2022-12-21 – 2022-12-23 (×3): 20 mg via ORAL
  Filled 2022-12-21 (×3): qty 1

## 2022-12-21 MED ORDER — OXYTOCIN-SODIUM CHLORIDE 30-0.9 UT/500ML-% IV SOLN
INTRAVENOUS | Status: AC
  Filled 2022-12-21: qty 500

## 2022-12-21 MED ORDER — IBUPROFEN 600 MG PO TABS
600.0000 mg | ORAL_TABLET | Freq: Four times a day (QID) | ORAL | Status: DC
  Administered 2022-12-21 – 2022-12-24 (×12): 600 mg via ORAL
  Filled 2022-12-21 (×11): qty 1

## 2022-12-21 MED ORDER — ZOLPIDEM TARTRATE 5 MG PO TABS: 5.0000 mg | ORAL_TABLET | Freq: Every evening | ORAL | Status: DC | PRN

## 2022-12-21 MED ORDER — DIPHENHYDRAMINE HCL 25 MG PO CAPS: 25.0000 mg | ORAL_CAPSULE | Freq: Four times a day (QID) | ORAL | Status: DC | PRN

## 2022-12-21 MED ORDER — ONDANSETRON HCL 4 MG PO TABS: 4.0000 mg | ORAL_TABLET | ORAL | Status: DC | PRN

## 2022-12-21 MED ORDER — NIFEDIPINE ER OSMOTIC RELEASE 30 MG PO TB24
30.0000 mg | ORAL_TABLET | Freq: Every day | ORAL | Status: DC
  Administered 2022-12-21 – 2022-12-22 (×2): 30 mg via ORAL
  Filled 2022-12-21 (×2): qty 1

## 2022-12-21 NOTE — Progress Notes (Signed)
Upon report patient complains of abdominal pain and a large amount of bleeding. RN did a fundal massage and expressed large clots and urine. Patient taken to the bathroom on the steady and voided a large amount. Patient was returned to bed and another fundal a was done with more clots. MD called and came to bedside. Attempted a Mel Almond but was unsuccessful. Sweep performed. Meds given- see MAR. Foley placed by Dr. Donavan Foil and ordered to keep in until at least 2pm.   Total EBL 1004 when MD left the room. Will continue 15 min for 1 hour fundal assessments.

## 2022-12-21 NOTE — Lactation Note (Addendum)
This note was copied from a baby's chart. Lactation Consultation Note  Patient Name: Sydney Rice ZOXWR'U Date: 12/21/2022 Age:26 hours Reason for consult: Initial assessment;Early term 37-38.6wks;Infant < 6lbs  P2, [redacted]w[redacted]d. PPH, Mother is experienced with breastfeeding.  She attempted breastfeeding but per RN baby was sleepy at the breast.  Goal for feeding supplementation is 12-14 ml today.  Assisted with paced feeding on her side with extra slow flow nipple with 15 ml of donor milk. Reminded family to feed with cues at least q 3 hours.   Mother had good flow of colostrum with hand expression.  Mother pumped 7 ml with DEBP.  Reviewed milk storage and cleaning and how to use hand pump.  Mother does not have DEBP at home and her insurance does not quality for a Stork pump.  Discussed contacting WIC to enroll or renting a pump in the gift shop.  Mom made aware of O/P services, breastfeeding support group, and our phone # for post-discharge questions.     Maternal Data Has patient been taught Hand Expression?: Yes Does the patient have breastfeeding experience prior to this delivery?: Yes How long did the patient breastfeed?: 15mos.  Feeding Mother's Current Feeding Choice: Breast Milk and Donor Milk Nipple Type: Extra Slow Flow Lactation Tools Discussed/Used Tools: Pump Breast pump type: Double-Electric Breast Pump Pump Education: Setup, frequency, and cleaning;Milk Storage Reason for Pumping: stimulation and supplementation Pumping frequency: q 3 hours Pumped volume: 7 mL  Interventions  Education  Discharge Pump: Manual;Personal;Refer for rental  Consult Status Consult Status: Follow-up Date: 12/22/22 Follow-up type: In-patient    Hardie Pulley  RN IBCLC 12/21/2022, 2:51 PM

## 2022-12-21 NOTE — Discharge Summary (Signed)
Postpartum Discharge Summary  Date of Service updated***     Patient Name: Sydney Rice DOB: 1996-06-01 MRN: [id number]  Date of admission: 12/20/2022 Delivery date:12/21/2022  Delivering provider: Celedonio Savage  Date of discharge: 12/21/2022  Admitting diagnosis: Preeclampsia, third trimester [O14.93] Intrauterine pregnancy: [redacted]w[redacted]d     Secondary diagnosis:  Principal Problem:   Preeclampsia, third trimester Active Problems:   Anemia in pregnancy   Vaginal delivery   Supervision of high risk pregnancy, antepartum   Hx of preeclampsia, prior pregnancy, currently pregnant   IUGR (intrauterine growth restriction) affecting care of mother  Additional problems: ***    Discharge diagnosis: Term Pregnancy Delivered and Gestational Hypertension                                              Post partum procedures:{Postpartum procedures:23558} Augmentation: AROM and Pitocin Complications: None  Hospital course: Induction of Labor With Vaginal Delivery   27 y.o. yo G2P1001 at [redacted]w[redacted]d was admitted to the hospital 12/20/2022 for induction of labor.  Indication for induction: Gestational hypertension and IUGR .  Patient had an labor course complicated by*** Membrane Rupture Time/Date: 10:46 PM ,12/20/2022   Delivery Method:Vaginal, Spontaneous  Episiotomy: None  Lacerations:  None  Details of delivery can be found in separate delivery note.  Patient had a postpartum course complicated by***. Patient is discharged home 12/21/22.  Newborn Data: Birth date:12/21/2022  Birth time:4:53 AM  Gender:Female  Living status:Living  Apgars:9 ,9  Weight:   Magnesium Sulfate received: No BMZ received: No Rhophylac:N/A MMR:N/A T-DaP:Given prenatally Flu: N/A Transfusion:{Transfusion received:30440034}  Physical exam  Vitals:   12/21/22 0332 12/21/22 0402 12/21/22 0432 12/21/22 0510  BP: (!) 150/89 (!) 140/84 (!) 151/114 (!) 141/83  Pulse: 73 76 100 80  Resp: 15 16 15 16   Temp:       TempSrc:      SpO2:       General: {Exam; general:21111117} Lochia: {Desc; appropriate/inappropriate:30686::"appropriate"} Uterine Fundus: {Desc; firm/soft:30687} Incision: {Exam; incision:21111123} DVT Evaluation: {Exam; dvt:2111122} Labs: Lab Results  Component Value Date   WBC 6.8 12/20/2022   HGB 10.0 (L) 12/20/2022   HCT 29.7 (L) 12/20/2022   MCV 91.1 12/20/2022   PLT 225 12/20/2022      Latest Ref Rng & Units 12/20/2022    4:00 PM  CMP  Glucose 70 - 99 mg/dL 99   BUN 6 - 20 mg/dL 7   Creatinine 7.82 - 9.56 mg/dL 2.13   Sodium 086 - 578 mmol/L 136   Potassium 3.5 - 5.1 mmol/L 3.5   Chloride 98 - 111 mmol/L 107   CO2 22 - 32 mmol/L 20   Calcium 8.9 - 10.3 mg/dL 8.7   Total Protein 6.5 - 8.1 g/dL 5.6   Total Bilirubin 0.3 - 1.2 mg/dL 0.4   Alkaline Phos 38 - 126 U/L 92   AST 15 - 41 U/L 20   ALT 0 - 44 U/L 12    Edinburgh Score:    02/13/2021   10:13 AM  Edinburgh Postnatal Depression Scale Screening Tool  I have been able to laugh and see the funny side of things. 0  I have looked forward with enjoyment to things. 0  I have blamed myself unnecessarily when things went wrong. 0  I have been anxious or worried for no good reason. 0  I have  felt scared or panicky for no good reason. 0  Things have been getting on top of me. 0  I have been so unhappy that I have had difficulty sleeping. 0  I have felt sad or miserable. 0  I have been so unhappy that I have been crying. 0  The thought of harming myself has occurred to me. 0  Edinburgh Postnatal Depression Scale Total 0     After visit meds:  Allergies as of 12/21/2022       Reactions   Venofer [iron Sucrose] Hives, Itching, Nausea And Vomiting, Swelling, Other (See Comments)   Chills, swelling of both hands and feet, low back pain, chest tightness on inhalation     Med Rec must be completed prior to using this SMARTLINK***        Discharge home in stable condition Infant Feeding: {Baby  feeding:23562} Infant Disposition:{CHL IP OB HOME WITH ZOXWRU:04540} Discharge instruction: per After Visit Summary and Postpartum booklet. Activity: Advance as tolerated. Pelvic rest for 6 weeks.  Diet: {OB JWJX:91478295} Future Appointments: Future Appointments  Date Time Provider Department Center  12/23/2022 10:30 AM [facility] NURSE [facility]  12/23/2022 10:45 AM [facility] US6 [facility]  12/30/2022 10:45 AM [facility] NURSE [facility]  12/30/2022 11:00 AM [facility] US1 [facility]   Follow up Visit: Message sent   Please schedule this patient for a In person postpartum visit in 6 weeks with the following provider: Any provider. Additional Postpartum F/U:BP check 1 week  High risk pregnancy complicated by: HTN Delivery mode:  Vaginal, Spontaneous  Anticipated Birth Control:  IUD OP   12/21/2022 Celedonio Savage, MD

## 2022-12-21 NOTE — Progress Notes (Signed)
Subjective: CTSP regarding increased postpartum bleeding.  Pt had a constant trickle of blood.  Pt had apparently just voided a large amount.  On initial, exam patient appeared to uterine atony.  Pursued constant fundal massage with some increase in uterine tone.  Nursing staff advised to give another 20 unit of pitocin IV.  I performed a uterine sweep and removed a significant amount of clot from the lower uterine segment.  Due to continued atony I did attempt to place the Rutherford device.  External cervix was 3-4 cm; however, internal  cervical os was closing down to about 1 cm and I could not easily place the device.  Foley catheter placed to decompress the bladder.  Nursing staff was ordered to give hemabate and immodium.  I was hesitant to use methergine due to pt's elevated blood pressure and diagnosis of gestational hypertension.  after the hemabate, pitocin and uterine massage, the uterus began to get firm.Fundal massage continued and the uterus remained firm.  Objective: Vital signs in last 24 hours: Temp:  [98.1 F (36.7 C)-99.4 F (37.4 C)] 98.6 F (37 C) (06/10 0750) Pulse Rate:  [62-208] 85 (06/10 0750) Resp:  [15-18] 18 (06/10 0750) BP: (106-155)/(68-114) 138/98 (06/10 0820) SpO2:  [99 %-100 %] 100 % (06/10 0750) Weight change:   Intake/Output from previous day: 06/09 0701 - 06/10 0700 In: -  Out: 1902 [Urine:1600; Blood:302] Intake/Output this shift: Total I/O In: -  Out: 403 [Blood:403]  See above  Lab Results: Recent Labs    12/20/22 1600  WBC 6.8  HGB 10.0*  HCT 29.7*  PLT 225   BMET:  Recent Labs    12/20/22 1600  NA 136  K 3.5  CL 107  CO2 20*  GLUCOSE 99  BUN 7  CREATININE 0.78  CALCIUM 8.7*   I have reviewed the patient's current medications.  Assessment/Plan: Postpartum hemorrhage, EBL of 1004.  Leave foley catheter in place until 1400. Fundal massage q 15 minutes for 1-2 hours, Continue pitocin as needed. Day team to reassess the patient in  1 hour.    LOS: 1 day   Warden Fillers 12/21/2022,8:45 AM

## 2022-12-21 NOTE — Progress Notes (Signed)
Updated Dr. Miquel Dunn on patients status. Patient is ambulation without dizziness. Ordered to discontinue foley catheter and continue to monitor.   Will notify MD if patients BP increases to 150's/100's. Royston Cowper, RN

## 2022-12-21 NOTE — Progress Notes (Addendum)
S: At bedside with RN Selena Batten to check on Sydney Rice. They note her fundus has remained firm and no further trickle or clots appreciated with fundal massage. She notes she is having some cramping and continues to have diarrhea, currently sitting on bedpan. Has been given immodium. She feels some cramping, has not gotten out of bed yet to see if any dizziness or other symptoms.   O: Vitals:   12/21/22 0845 12/21/22 0915  BP: (!) 147/94 (!) 130/90  Pulse: 77 70  Resp:    Temp:    SpO2:     General: alert & oriented, no apparent distress, well groomed HEENT: normocephalic, atraumatic, EOM grossly intact, oral mucosa moist, neck supple Respiratory: normal respiratory effort GI: non-distended, fundus firm and below umbilicus, no clot/bleeding with fundal massage GU: foley in place, draining clear urine Skin: no rashes, no jaundice Psych: appropriate mood and affect  A/P: PPH, EBL of 1004 - s/p foley placement, pitocin 20 units, hemabate - fundus remains firm, will leave foley until 1400 and can remove if no further bleeding episodes, Hgb 10.0 yesterday - Check CBC tomorrow AM, or sooner if patient symptomatic  Gestational HTN - pre-E labs negative - lasix 20mg  PO x 5 days, Procardia 30mg  XL started today - BP mild range, asymptomatic, will monitor closely  Burley Saver MD [facility]-OB

## 2022-12-22 ENCOUNTER — Other Ambulatory Visit (HOSPITAL_COMMUNITY): Payer: Self-pay

## 2022-12-22 LAB — CBC
HCT: 21.9 % — ABNORMAL LOW (ref 36.0–46.0)
Hemoglobin: 7.5 g/dL — ABNORMAL LOW (ref 12.0–15.0)
MCH: 31.3 pg (ref 26.0–34.0)
MCHC: 34.2 g/dL (ref 30.0–36.0)
MCV: 91.3 fL (ref 80.0–100.0)
Platelets: 164 10*3/uL (ref 150–400)
RBC: 2.4 MIL/uL — ABNORMAL LOW (ref 3.87–5.11)
RDW: 13.4 % (ref 11.5–15.5)
WBC: 6.9 10*3/uL (ref 4.0–10.5)
nRBC: 0 % (ref 0.0–0.2)

## 2022-12-22 MED ORDER — DOCUSATE SODIUM 100 MG PO CAPS
100.0000 mg | ORAL_CAPSULE | Freq: Two times a day (BID) | ORAL | 2 refills | Status: AC | PRN
  Filled 2022-12-22: qty 100, 50d supply, fill #0

## 2022-12-22 MED ORDER — IBUPROFEN 600 MG PO TABS
600.0000 mg | ORAL_TABLET | Freq: Four times a day (QID) | ORAL | 0 refills | Status: DC
  Filled 2022-12-22: qty 30, 8d supply, fill #0

## 2022-12-22 MED ORDER — FUROSEMIDE 20 MG PO TABS
20.0000 mg | ORAL_TABLET | Freq: Every day | ORAL | 0 refills | Status: DC
  Filled 2022-12-22: qty 3, 3d supply, fill #0

## 2022-12-22 MED ORDER — NIFEDIPINE ER 30 MG PO TB24
30.0000 mg | ORAL_TABLET | Freq: Every day | ORAL | 0 refills | Status: DC
  Filled 2022-12-22: qty 30, 30d supply, fill #0

## 2022-12-22 MED ORDER — ACETAMINOPHEN 325 MG PO TABS
650.0000 mg | ORAL_TABLET | ORAL | 0 refills | Status: AC | PRN
  Filled 2022-12-22: qty 100, 9d supply, fill #0

## 2022-12-22 NOTE — Discharge Instructions (Signed)

## 2022-12-22 NOTE — Anesthesia Postprocedure Evaluation (Signed)
Anesthesia Post Note  Patient: Sydney Rice  Procedure(s) Performed: AN AD HOC LABOR EPIDURAL     Patient location during evaluation: Mother Baby Anesthesia Type: Epidural Level of consciousness: awake and alert Pain management: pain level controlled Vital Signs Assessment: post-procedure vital signs reviewed and stable Respiratory status: spontaneous breathing, nonlabored ventilation and respiratory function stable Cardiovascular status: stable Postop Assessment: no headache, no backache and epidural receding Anesthetic complications: no   No notable events documented.  Last Vitals:  Vitals:   12/22/22 0556 12/22/22 0637  BP: (!) 131/91 132/88  Pulse: 80   Resp: 18   Temp: 36.8 C   SpO2: 100%     Last Pain:  Vitals:   12/22/22 0749  TempSrc:   PainSc: 4    Pain Goal:                   Rica Records

## 2022-12-23 ENCOUNTER — Ambulatory Visit: Payer: Medicaid Other

## 2022-12-23 ENCOUNTER — Other Ambulatory Visit (HOSPITAL_COMMUNITY): Payer: Self-pay

## 2022-12-23 LAB — PREPARE RBC (CROSSMATCH)

## 2022-12-23 LAB — CBC
HCT: 26.6 % — ABNORMAL LOW (ref 36.0–46.0)
Hemoglobin: 9 g/dL — ABNORMAL LOW (ref 12.0–15.0)
MCH: 30.5 pg (ref 26.0–34.0)
MCHC: 33.8 g/dL (ref 30.0–36.0)
MCV: 90.2 fL (ref 80.0–100.0)
Platelets: 189 10*3/uL (ref 150–400)
RBC: 2.95 MIL/uL — ABNORMAL LOW (ref 3.87–5.11)
RDW: 13.5 % (ref 11.5–15.5)
WBC: 6.7 10*3/uL (ref 4.0–10.5)
nRBC: 0 % (ref 0.0–0.2)

## 2022-12-23 MED ORDER — SODIUM CHLORIDE 0.9% IV SOLUTION: Freq: Once | INTRAVENOUS | Status: AC

## 2022-12-23 MED ORDER — NIFEDIPINE ER 60 MG PO TB24
60.0000 mg | ORAL_TABLET | Freq: Every day | ORAL | 0 refills | Status: DC
  Filled 2022-12-23: qty 30, 30d supply, fill #0

## 2022-12-23 MED ORDER — NIFEDIPINE ER OSMOTIC RELEASE 30 MG PO TB24
60.0000 mg | ORAL_TABLET | Freq: Every day | ORAL | Status: DC
  Administered 2022-12-23 – 2022-12-24 (×2): 60 mg via ORAL
  Filled 2022-12-23 (×2): qty 2

## 2022-12-23 NOTE — Progress Notes (Signed)
Post Partum Day 2 Subjective: Eating, drinking, voiding, ambulating well.  +flatus.  Lochia and pain wnl.  Denies dizziness, lightheadedness, or sob. Discussed hemoglobin drop/anemia, offered IV Fe vs PRBC, had anaphylactic reaction to IV Venofer in past, pt would like to blood transfusion. Denies ha, visual changes, ruq/epigastric pain, n/v.    Objective: Blood pressure (!) 125/91, pulse 76, temperature 98.1 F (36.7 C), temperature source Oral, resp. rate 16, last menstrual period 04/04/2022, SpO2 100 %, unknown if currently breastfeeding.  Physical Exam:  General: alert, cooperative, and no distress Lochia: appropriate Uterine Fundus: firm Incision: n/a DVT Evaluation: No evidence of DVT seen on physical exam. Negative Homan's sign. No cords or calf tenderness. No significant calf/ankle edema.  Recent Labs    12/20/22 1600 12/22/22 0638  HGB 10.0* 7.5*  HCT 29.7* 21.9*    Assessment/Plan: Had delayed PPH, EBL 1L+, hgb 10.0>7.5, asymptomatic but w/ anaphylactic reaction IV Venofer in past. Reviewed risks/benefits of blood transfusion, pt would like to proceed. Reviewed w/ Dr. Alysia Penna who agrees. Orders placed. DBP still elevated on nifedipine 30mg , increase to 60mg  daily. Continue lasix. If bp improved later this evening, may consider d/c.    LOS: 3 days   Cheral Marker, CNM 12/23/2022, 6:30 AM

## 2022-12-23 NOTE — Progress Notes (Signed)
Blood started after 15 minutes of starting patient's IV was leaking and burning so IV was Dc'd. IV consult put it and 1st call L& D resident called and notified of restarting IV and of a Diastolic of 94.. no new orders.

## 2022-12-24 ENCOUNTER — Other Ambulatory Visit (HOSPITAL_COMMUNITY): Payer: Self-pay

## 2022-12-24 LAB — BPAM RBC
Blood Product Expiration Date: 202406192359
ISSUE DATE / TIME: 202406120859
Unit Type and Rh: 5100

## 2022-12-24 LAB — TYPE AND SCREEN: Unit division: 0

## 2022-12-24 LAB — SURGICAL PATHOLOGY

## 2022-12-24 MED ORDER — FUROSEMIDE 20 MG PO TABS
20.0000 mg | ORAL_TABLET | Freq: Two times a day (BID) | ORAL | Status: DC
  Administered 2022-12-24: 20 mg via ORAL
  Filled 2022-12-24: qty 1

## 2022-12-24 MED ORDER — FUROSEMIDE 20 MG PO TABS
20.0000 mg | ORAL_TABLET | Freq: Two times a day (BID) | ORAL | 0 refills | Status: DC
  Filled 2022-12-24: qty 8, 4d supply, fill #0

## 2022-12-24 NOTE — Lactation Note (Signed)
This note was copied from a baby's chart. Lactation Consultation Note  Patient Name: [place] Rice [id number] Date: 12/24/2022 Age:26 hours Reason for consult: Follow-up assessment;Primapara;1st time breastfeeding;Early term 37-38.6wks;Infant < 6lbs;Infant weight loss (Mom , dad and baby ready for D/C . LC  reviewed BF D/C teaching, and the potential feeding behaviors of and Early term, less than 6 pound infant. MIlk is in) LC reviewed the feeding goals for 24 hours - feed with feeding cues and by 3 hours if the baby isn't awake check her diaper, change if needed and proceed to feed. If baby is sluggish feed the baby an appetizer of EBM 10 ml and proceed to latch, if still sluggish complete the supplement, call it a feeding and post pump 15 mins , save milk.  LC recommended feeding the 1st breast, supplement working up to 30 ml per feeding and post pump. The next feeding switch to the other breast.  LC reviewed BF D/C teaching and offered to request and LC O/P and mom receptive.   Maternal Data    Feeding Mother's Current Feeding Choice: Breast Milk and Donor Milk  LATCH Score - Mom latched the baby, LC added pillow support for better depth , multiple swallows noted and baby still feeding.     Lactation Tools Discussed/Used Tools: Pump Breast pump type: Double-Electric Breast Pump;Manual Pump Education: Milk Storage  Interventions Interventions: Breast feeding basics reviewed;Hand pump;DEBP;Education;Pace feeding;LC Services brochure  Discharge Discharge Education: Engorgement and breast care;Warning signs for feeding baby;Outpatient recommendation;Outpatient Epic message sent Pump: Personal;Manual;Rented (LC reminded  mom and recommended to call [facility] for her DEBP, in the mean time rent a DEBP from the gift shop.) [facility] Program: No  Consult Status Consult Status: Complete Date: 12/24/22 Follow-up type: In-patient    Matilde Sprang Duaa Stelzner 12/24/2022, 11:17 AM

## 2022-12-24 NOTE — Discharge Summary (Signed)
Postpartum Discharge Summary    Patient Name: Sydney Rice DOB: 21-Mar-1996 MRN: [id number]  Date of admission: 12/20/2022 Delivery date:12/21/2022  Delivering provider: Celedonio Savage  Date of discharge: 12/24/2022  Admitting diagnosis: Preeclampsia, third trimester [O14.93] Intrauterine pregnancy: [redacted]w[redacted]d     Secondary diagnosis:  Principal Problem:   Preeclampsia, third trimester Active Problems:   Anemia in pregnancy   Vaginal delivery   Supervision of high risk pregnancy, antepartum   Hx of preeclampsia, prior pregnancy, currently pregnant   IUGR (intrauterine growth restriction) affecting care of mother   Postpartum hemorrhage  Additional problems: Iron deficiency anemia    Discharge diagnosis: Term Pregnancy Delivered and Gestational Hypertension                                              Post partum procedures: N/A Augmentation: AROM and Pitocin Complications: None  Hospital course: Induction of Labor With Vaginal Delivery   27 y.o. yo G2P1001 at [redacted]w[redacted]d was admitted to the hospital 12/20/2022 for induction of labor.  Indication for induction: Gestational hypertension and IUGR .  Patient had labor course c/b QBL 1004 mL requiring Hemabate x 1 administration. Membrane Rupture Time/Date: 10:46 PM ,12/20/2022   Delivery Method:Vaginal, Spontaneous  Episiotomy: None  Lacerations:  None  Details of delivery can be found in separate delivery note.  Patient had postpartum course c/b anemia. Venofer deferred due to history of anaphylactic reaction. She received 1 unit pRBCs with improved hgb of 9.0 and improvement of symptoms. Due to elevated blood pressure postpartum she was discharged on 60mg  procardia and 20 mg lasix twice daily. Patient is discharged home 12/24/22.  Newborn Data: Birth date:12/21/2022  Birth time:4:53 AM  Gender:Female  Living status:Living  Apgars:9 ,9  Weight:2480 g   Magnesium Sulfate received: No BMZ received:  No Rhophylac:N/A MMR:N/A T-DaP:Given prenatally Flu: N/A Transfusion:No  Physical exam  Vitals:   12/23/22 1311 12/23/22 1356 12/23/22 2111 12/24/22 0538  BP: (!) 129/94 119/85 (!) 122/91 (!) 128/95  Pulse: 85 76 81 76  Resp: 18 18 18 19   Temp: 99 F (37.2 C) 98.4 F (36.9 C) 98.9 F (37.2 C) 98.2 F (36.8 C)  TempSrc: Oral Oral Oral Oral  SpO2:  99% 100% 99%   General: alert, cooperative, and no distress Lochia: appropriate Uterine Fundus: firm Incision: N/A DVT Evaluation: No evidence of DVT seen on physical exam. Labs: Lab Results  Component Value Date   WBC 6.7 12/23/2022   HGB 9.0 (L) 12/23/2022   HCT 26.6 (L) 12/23/2022   MCV 90.2 12/23/2022   PLT 189 12/23/2022      Latest Ref Rng & Units 12/20/2022    4:00 PM  CMP  Glucose 70 - 99 mg/dL 99   BUN 6 - 20 mg/dL 7   Creatinine 4.09 - 8.11 mg/dL 9.14   Sodium 782 - 956 mmol/L 136   Potassium 3.5 - 5.1 mmol/L 3.5   Chloride 98 - 111 mmol/L 107   CO2 22 - 32 mmol/L 20   Calcium 8.9 - 10.3 mg/dL 8.7   Total Protein 6.5 - 8.1 g/dL 5.6   Total Bilirubin 0.3 - 1.2 mg/dL 0.4   Alkaline Phos 38 - 126 U/L 92   AST 15 - 41 U/L 20   ALT 0 - 44 U/L 12    Edinburgh Score:    12/21/2022   11:21  PM  Edinburgh Postnatal Depression Scale Screening Tool  I have been able to laugh and see the funny side of things. 0  I have looked forward with enjoyment to things. 0  I have blamed myself unnecessarily when things went wrong. 0  I have been anxious or worried for no good reason. 0  I have felt scared or panicky for no good reason. 0  Things have been getting on top of me. 0  I have been so unhappy that I have had difficulty sleeping. 0  I have felt sad or miserable. 0  I have been so unhappy that I have been crying. 0  The thought of harming myself has occurred to me. 0  Edinburgh Postnatal Depression Scale Total 0     After visit meds:  Allergies as of 12/24/2022       Reactions   Venofer [iron Sucrose] Hives,  Itching, Nausea And Vomiting, Swelling, Other (See Comments)   12/22/2022: patient stated she had anaphylactic rxn Chills, swelling of both hands and feet, low back pain, chest tightness on inhalation        Medication List     STOP taking these medications    aspirin EC 81 MG tablet       TAKE these medications    acetaminophen 325 MG tablet Commonly known as: TYLENOL Take 2 tablets (650 mg total) by mouth every 4 (four) hours as needed for up to 30 doses (for pain scale < 4).   docusate sodium 100 MG capsule Commonly known as: COLACE Take 1 capsule (100 mg total) by mouth 2 (two) times daily as needed.   ferrous gluconate 324 MG tablet Commonly known as: FERGON Take 1 tablet (324 mg total) by mouth every other day.   furosemide 20 MG tablet Commonly known as: LASIX Take 1 tablet (20 mg total) by mouth 2 (two) times daily for 4 days.   ibuprofen 600 MG tablet Commonly known as: ADVIL Take 1 tablet (600 mg total) by mouth every 6 (six) hours.   NIFEdipine 60 MG 24 hr tablet Commonly known as: ADALAT CC Take 1 tablet (60 mg total) by mouth daily.   prenatal multivitamin Tabs tablet Take 1 tablet by mouth daily at 12 noon.         Discharge home in stable condition Infant Feeding: Breast Infant Disposition:home with mother Discharge instruction: per After Visit Summary and Postpartum booklet. Activity: Advance as tolerated. Pelvic rest for 6 weeks.  Diet: routine diet   Follow up Visit: Message sent   Please schedule this patient for a In person postpartum visit in 6 weeks with the following provider: Any provider. Additional Postpartum F/U:BP check 1 week  High risk pregnancy complicated by: HTN Delivery mode:  Vaginal, Spontaneous  Anticipated Birth Control:  IUD OP  --Patient caring for baby, ambulating independently without SOB, dizziness, or weakness day of discharge  Clayton Bibles, MSA, MSN, CNM Certified Nurse Midwife, Nurse, mental health for [employer], [facility]

## 2022-12-24 NOTE — Lactation Note (Signed)
This note was copied from a baby's chart. Lactation Consultation Note  Patient Name: Girl Emmagene Ortner [id number] Date: 12/24/2022 Age:26 hours Reason for consult: Follow-up assessment (per dad mom is in the shower. LC asked dad to have mom call when she is finished for [facility])   Maternal Data    Feeding Mother's Current Feeding Choice: Breast Milk and Donor Milk Nipple Type: Extra Slow Flow  Consult Status Consult Status: Follow-up Date: 12/24/22 Follow-up type: In-patient    Matilde Sprang Kelie Gainey 12/24/2022, 10:25 AM

## 2022-12-30 ENCOUNTER — Ambulatory Visit: Payer: Medicaid Other

## 2023-01-01 ENCOUNTER — Ambulatory Visit: Payer: Medicaid Other

## 2023-01-09 ENCOUNTER — Telehealth (HOSPITAL_COMMUNITY): Payer: Self-pay

## 2023-01-09 NOTE — Telephone Encounter (Signed)
01/09/2023 1622  Name: Sydney Rice MRN: [id number] DOB: 08-02-1995  Reason for Call:  Transition of Care Hospital Discharge Call  Contact Status: Patient Contact Status: Message  Language assistant needed:          Follow-Up Questions:    Inocente Salles Postnatal Depression Scale:  In the Past 7 Days:    PHQ2-9 Depression Scale:     Discharge Follow-up:    Post-discharge interventions: NA  Signature  Signe Colt

## 2023-02-02 ENCOUNTER — Encounter: Payer: Self-pay | Admitting: Family Medicine

## 2023-02-02 ENCOUNTER — Ambulatory Visit (INDEPENDENT_AMBULATORY_CARE_PROVIDER_SITE_OTHER): Payer: Medicaid Other | Admitting: Family Medicine

## 2023-02-02 ENCOUNTER — Other Ambulatory Visit: Payer: Self-pay

## 2023-02-02 DIAGNOSIS — O165 Unspecified maternal hypertension, complicating the puerperium: Secondary | ICD-10-CM

## 2023-02-02 MED ORDER — NIFEDIPINE ER 60 MG PO TB24
60.0000 mg | ORAL_TABLET | Freq: Every day | ORAL | 0 refills | Status: AC
Start: 2023-02-02 — End: 2023-03-04

## 2023-02-02 NOTE — Progress Notes (Signed)
Post Partum Visit Note  Sydney Rice is a 27 y.o. G33P2002 female who presents for a postpartum visit. She is  6.1  weeks postpartum following a normal spontaneous vaginal delivery.  I have fully reviewed the prenatal and intrapartum course. The delivery was at 37.2 gestational weeks.  Anesthesia: epidural. Postpartum course has been good. Baby is doing well. Baby is feeding by breast. Bleeding staining only and brown. Bowel function is normal. Bladder function is normal. Patient is sexually active. Contraception method is none. Postpartum depression screening: negative.  The pregnancy intention screening data noted above was reviewed. Potential methods of contraception were discussed. The patient elected to proceed with No data recorded.   Health Maintenance Due  Topic Date Due   HPV VACCINES (2 - 3-dose series) 06/13/2015   COVID-19 Vaccine (1 - 2023-24 season) Never done   The following portions of the patient's history were reviewed and updated as appropriate: allergies, current medications, past social history, and problem list.  Review of Systems Pertinent items are noted in HPI.  Objective:  LMP 04/04/2022    General:  alert, cooperative, and appears stated age   Breasts:  not indicated  Lungs: clear to auscultation bilaterally  Heart:  regular rate and rhythm, S1, S2 normal, no murmur, click, rub or gallop  Abdomen: soft, non-tender; bowel sounds normal; no masses,  no organomegaly   Wound N/a. No perineal or surgical wound  GU exam:  not indicated       Assessment:   Postpartum care and examination Doing well overall. 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 Busy time; but looking forward.  Postpartum hypertension -  inconsistent with PO nifedipine; discussed methods of helping her remember to take the medication. Continue for the next 1 month. - establish care with a new PCP after  moving to [facility] for ongoing follow up for her blood pressures.   Normal postpartum exam.   Plan:   Essential components of care per ACOG recommendations:  1.  Mood and well being: Patient with negative depression screening today. Reviewed local resources for support.  - Patient tobacco use? No.   - hx of drug use? No.    2. Infant care and feeding:  -Patient currently breastmilk feeding? Yes. Discussed returning to school and pumping. Reviewed importance of draining breast regularly to support lactation.  -Social determinants of health (SDOH) reviewed in EPIC. No concerns  3. Sexuality, contraception and birth spacing - Patient does not want a pregnancy in the next year.  Desired family size is unknown - Reviewed reproductive life planning. Reviewed contraceptive methods based on pt preferences and effectiveness.  Patient desired Abstinence and FAM or LAM today. Trying to avoid hormones, but not excited about paragard IUD due to risk of heavy bleeding.  - Discussed formal learning of a  FABM if that may be suitable for herself and her partner.  - Discussed birth spacing of 18 months  4. Sleep and fatigue -Encouraged family/partner/community support of 4 hrs of uninterrupted sleep to help with mood and fatigue  5. Physical Recovery  - Discussed patients delivery and complications. She describes her labor as good overall, despite some challenging moments due to postpartum hemorrhage. - Patient had a Vaginal, complicated by postpartum hemorrhage requiring hemabate. She lost about 1000cc of blood. Patient had no laceration.  - Patient has urinary incontinence? No. - Patient is safe to resume physical and sexual activity  6.  Health Maintenance - HM due items addressed Yes - Last pap smear  Diagnosis  Date Value Ref Range Status  02/13/2021   Final   - Negative for intraepithelial lesion or malignancy (NILM)   Pap smear not done at today's visit. Next due in 02/2024. -Breast  Cancer screening indicated? No.   7. Chronic Disease/Pregnancy Condition follow up: Hypertension  - PCP follow up  Sheppard Evens MD MPH OB Fellow, Faculty Practice [facility], [facility] for [facility] 02/02/2023

## 2023-02-17 ENCOUNTER — Telehealth: Payer: Self-pay | Admitting: *Deleted

## 2023-02-17 MED ORDER — NYSTATIN 100000 UNIT/GM EX CREA: TOPICAL_CREAM | CUTANEOUS | 1 refills | Status: AC

## 2023-02-17 NOTE — Telephone Encounter (Signed)
Pt left voicemail message stating that her 2 mo old baby has been diagnosed with Thrush. The pediatrician recommended that she contact us for treatment as well. I called her back and advised that I will send in Rx for Nystatin cream. Instructions for use was discussed. Pt was also advised that I have sent a message to her Mychart with additional treatment recommendations. She voiced understanding.
